# Patient Record
Sex: Male | Born: 1965 | Race: Black or African American | Hispanic: No | Marital: Single | Smoking: Current every day smoker
Health system: Southern US, Community
[De-identification: ages and names within clinical notes are randomized; demographics above are authoritative.]

## PROBLEM LIST (undated history)

## (undated) DIAGNOSIS — IMO0002 Reserved for concepts with insufficient information to code with codable children: Secondary | ICD-10-CM

## (undated) DIAGNOSIS — F419 Anxiety disorder, unspecified: Secondary | ICD-10-CM

## (undated) DIAGNOSIS — J45909 Unspecified asthma, uncomplicated: Secondary | ICD-10-CM

## (undated) DIAGNOSIS — E785 Hyperlipidemia, unspecified: Secondary | ICD-10-CM

## (undated) DIAGNOSIS — I10 Essential (primary) hypertension: Secondary | ICD-10-CM

## (undated) DIAGNOSIS — E7800 Pure hypercholesterolemia, unspecified: Secondary | ICD-10-CM

## (undated) HISTORY — DX: Reserved for concepts with insufficient information to code with codable children: IMO0002

## (undated) HISTORY — DX: Hyperlipidemia, unspecified: E78.5

## (undated) HISTORY — DX: Essential (primary) hypertension: I10

## (undated) HISTORY — PX: CHOLECYSTECTOMY: SHX55

## (undated) HISTORY — PX: OTHER SURGICAL HISTORY: SHX169

## (undated) HISTORY — DX: Anxiety disorder, unspecified: F41.9

## (undated) HISTORY — DX: Unspecified asthma, uncomplicated: J45.909
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
## 2007-02-07 ENCOUNTER — Encounter: Admission: RE | Admit: 2007-02-07 | Discharge: 2007-02-07 | Payer: Self-pay | Admitting: Internal Medicine

## 2008-01-19 ENCOUNTER — Emergency Department (HOSPITAL_BASED_OUTPATIENT_CLINIC_OR_DEPARTMENT_OTHER): Admission: EM | Admit: 2008-01-19 | Discharge: 2008-01-19 | Payer: Self-pay | Admitting: Emergency Medicine

## 2008-05-17 ENCOUNTER — Ambulatory Visit: Payer: Self-pay | Admitting: Diagnostic Radiology

## 2008-05-17 ENCOUNTER — Emergency Department (HOSPITAL_BASED_OUTPATIENT_CLINIC_OR_DEPARTMENT_OTHER): Admission: EM | Admit: 2008-05-17 | Discharge: 2008-05-17 | Payer: Self-pay | Admitting: Emergency Medicine

## 2008-07-26 ENCOUNTER — Ambulatory Visit: Payer: Self-pay | Admitting: Diagnostic Radiology

## 2008-07-26 ENCOUNTER — Emergency Department (HOSPITAL_BASED_OUTPATIENT_CLINIC_OR_DEPARTMENT_OTHER): Admission: EM | Admit: 2008-07-26 | Discharge: 2008-07-26 | Payer: Self-pay | Admitting: Emergency Medicine

## 2008-10-08 ENCOUNTER — Ambulatory Visit: Payer: Self-pay | Admitting: Diagnostic Radiology

## 2008-10-08 ENCOUNTER — Emergency Department (HOSPITAL_BASED_OUTPATIENT_CLINIC_OR_DEPARTMENT_OTHER): Admission: EM | Admit: 2008-10-08 | Discharge: 2008-10-08 | Payer: Self-pay | Admitting: Emergency Medicine

## 2009-01-14 ENCOUNTER — Emergency Department (HOSPITAL_BASED_OUTPATIENT_CLINIC_OR_DEPARTMENT_OTHER): Admission: EM | Admit: 2009-01-14 | Discharge: 2009-01-14 | Payer: Self-pay | Admitting: Emergency Medicine

## 2009-04-08 ENCOUNTER — Emergency Department (HOSPITAL_BASED_OUTPATIENT_CLINIC_OR_DEPARTMENT_OTHER): Admission: EM | Admit: 2009-04-08 | Discharge: 2009-04-08 | Payer: Self-pay | Admitting: Emergency Medicine

## 2010-05-30 ENCOUNTER — Emergency Department (HOSPITAL_BASED_OUTPATIENT_CLINIC_OR_DEPARTMENT_OTHER)
Admission: EM | Admit: 2010-05-30 | Discharge: 2010-05-30 | Payer: Self-pay | Source: Home / Self Care | Admitting: Emergency Medicine

## 2010-09-12 LAB — COMPREHENSIVE METABOLIC PANEL
Alkaline Phosphatase: 90 U/L (ref 39–117)
BUN: 15 mg/dL (ref 6–23)
CO2: 28 mEq/L (ref 19–32)
Calcium: 9.1 mg/dL (ref 8.4–10.5)
GFR calc non Af Amer: >60 mL/min (ref >60)
Glucose, Bld: 83 mg/dL (ref 70–99)
Total Protein: 8.1 g/dL (ref 6.0–8.3)

## 2010-09-12 LAB — LIPASE, BLOOD: Lipase: 51 U/L (ref 23–300)

## 2010-09-12 LAB — URINALYSIS, ROUTINE W REFLEX MICROSCOPIC
Bilirubin Urine: NEGATIVE
Glucose, UA: NEGATIVE mg/dL
Hgb urine dipstick: NEGATIVE
Specific Gravity, Urine: 1.024 (ref 1.005–1.030)
pH: 5.5 (ref 5.0–8.0)

## 2010-09-12 LAB — DIFFERENTIAL
Basophils Absolute: 0 10*3/uL (ref 0.0–0.1)
Eosinophils Absolute: 0.5 10*3/uL (ref 0.0–0.7)
Lymphocytes Relative: 49 % — ABNORMAL HIGH (ref 12–46)
Lymphs Abs: 2.5 10*3/uL (ref 0.7–4.0)
Neutrophils Relative %: 28 % — ABNORMAL LOW (ref 43–77)

## 2010-09-12 LAB — CBC
HCT: 45.5 % (ref 39.0–52.0)
Hemoglobin: 15.5 g/dL (ref 13.0–17.0)
MCHC: 34 g/dL (ref 30.0–36.0)
MCV: 94.1 fL (ref 78.0–100.0)
RBC: 4.84 MIL/uL (ref 4.22–5.81)

## 2010-11-03 ENCOUNTER — Emergency Department (HOSPITAL_BASED_OUTPATIENT_CLINIC_OR_DEPARTMENT_OTHER)
Admission: EM | Admit: 2010-11-03 | Discharge: 2010-11-03 | Disposition: A | Discharge status: Home / Self Care | Payer: Managed Care, Other (non HMO) | Attending: Emergency Medicine | Admitting: Emergency Medicine

## 2010-11-03 ENCOUNTER — Emergency Department (INDEPENDENT_AMBULATORY_CARE_PROVIDER_SITE_OTHER): Payer: Managed Care, Other (non HMO)

## 2010-11-03 DIAGNOSIS — R079 Chest pain, unspecified: Secondary | ICD-10-CM

## 2010-11-03 DIAGNOSIS — G8929 Other chronic pain: Secondary | ICD-10-CM | POA: Insufficient documentation

## 2010-11-03 DIAGNOSIS — I10 Essential (primary) hypertension: Secondary | ICD-10-CM | POA: Insufficient documentation

## 2010-11-03 DIAGNOSIS — R0602 Shortness of breath: Secondary | ICD-10-CM

## 2010-11-03 DIAGNOSIS — R0789 Other chest pain: Secondary | ICD-10-CM | POA: Insufficient documentation

## 2010-11-03 DIAGNOSIS — F17200 Nicotine dependence, unspecified, uncomplicated: Secondary | ICD-10-CM | POA: Insufficient documentation

## 2010-11-03 DIAGNOSIS — J45909 Unspecified asthma, uncomplicated: Secondary | ICD-10-CM | POA: Insufficient documentation

## 2011-11-09 ENCOUNTER — Encounter: Payer: Self-pay | Admitting: Internal Medicine

## 2011-11-09 DIAGNOSIS — J45909 Unspecified asthma, uncomplicated: Secondary | ICD-10-CM | POA: Insufficient documentation

## 2011-11-09 DIAGNOSIS — Z0000 Encounter for general adult medical examination without abnormal findings: Secondary | ICD-10-CM | POA: Insufficient documentation

## 2011-11-09 DIAGNOSIS — I10 Essential (primary) hypertension: Secondary | ICD-10-CM | POA: Insufficient documentation

## 2011-11-12 ENCOUNTER — Other Ambulatory Visit (INDEPENDENT_AMBULATORY_CARE_PROVIDER_SITE_OTHER): Payer: Managed Care, Other (non HMO)

## 2011-11-12 ENCOUNTER — Encounter: Payer: Self-pay | Admitting: Internal Medicine

## 2011-11-12 ENCOUNTER — Ambulatory Visit (INDEPENDENT_AMBULATORY_CARE_PROVIDER_SITE_OTHER): Payer: Managed Care, Other (non HMO) | Admitting: Internal Medicine

## 2011-11-12 VITALS — BP 136/90 | HR 91 | Temp 98.0°F | Resp 16 | Ht 69.0 in | Wt 197.8 lb

## 2011-11-12 DIAGNOSIS — E785 Hyperlipidemia, unspecified: Secondary | ICD-10-CM | POA: Insufficient documentation

## 2011-11-12 DIAGNOSIS — M545 Low back pain, unspecified: Secondary | ICD-10-CM

## 2011-11-12 DIAGNOSIS — I10 Essential (primary) hypertension: Secondary | ICD-10-CM

## 2011-11-12 DIAGNOSIS — G8929 Other chronic pain: Secondary | ICD-10-CM

## 2011-11-12 DIAGNOSIS — Z0000 Encounter for general adult medical examination without abnormal findings: Secondary | ICD-10-CM

## 2011-11-12 LAB — BASIC METABOLIC PANEL
BUN: 15 mg/dL (ref 6–23)
Chloride: 104 mEq/L (ref 96–112)
Creatinine, Ser: 0.9 mg/dL (ref 0.4–1.5)
Glucose, Bld: 88 mg/dL (ref 70–99)

## 2011-11-12 LAB — URINALYSIS, ROUTINE W REFLEX MICROSCOPIC
Bilirubin Urine: NEGATIVE
Leukocytes, UA: NEGATIVE
Nitrite: NEGATIVE
Specific Gravity, Urine: >=1.03 (ref 1.000–1.030)
Total Protein, Urine: NEGATIVE
pH: 5.5 (ref 5.0–8.0)

## 2011-11-12 LAB — CBC WITH DIFFERENTIAL/PLATELET
Basophils Relative: 0.6 % (ref 0.0–3.0)
Eosinophils Relative: 9.7 % — ABNORMAL HIGH (ref 0.0–5.0)
Lymphocytes Relative: 39.1 % (ref 12.0–46.0)
MCV: 95.4 fl (ref 78.0–100.0)
Monocytes Relative: 9.6 % (ref 3.0–12.0)
Neutrophils Relative %: 41 % — ABNORMAL LOW (ref 43.0–77.0)
Platelets: 237 10*3/uL (ref 150.0–400.0)
RBC: 4.96 Mil/uL (ref 4.22–5.81)
WBC: 5 10*3/uL (ref 4.5–10.5)

## 2011-11-12 LAB — HEPATIC FUNCTION PANEL
ALT: 32 U/L (ref 0–53)
AST: 19 U/L (ref 0–37)
Albumin: 4.1 g/dL (ref 3.5–5.2)
Total Bilirubin: 0.6 mg/dL (ref 0.3–1.2)
Total Protein: 7.3 g/dL (ref 6.0–8.3)

## 2011-11-12 LAB — LIPID PANEL
Cholesterol: 222 mg/dL — ABNORMAL HIGH (ref 0–200)
VLDL: 19.4 mg/dL (ref 0.0–40.0)

## 2011-11-12 LAB — PSA: PSA: 0.51 ng/mL (ref 0.10–4.00)

## 2011-11-12 LAB — TSH: TSH: 0.41 u[IU]/mL (ref 0.35–5.50)

## 2011-11-12 LAB — LDL CHOLESTEROL, DIRECT: Direct LDL: 153.6 mg/dL

## 2011-11-12 MED ORDER — AMLODIPINE-OLMESARTAN 5-40 MG PO TABS: 1.0000 | ORAL_TABLET | Freq: Every day | ORAL | Status: DC

## 2011-11-12 MED ORDER — OXYCODONE HCL 10 MG PO TABS: 10.0000 mg | ORAL_TABLET | Freq: Two times a day (BID) | ORAL | Status: DC | PRN

## 2011-11-12 MED ORDER — ALBUTEROL SULFATE HFA 108 (90 BASE) MCG/ACT IN AERS: 2.0000 | INHALATION_SPRAY | Freq: Four times a day (QID) | RESPIRATORY_TRACT | Status: DC | PRN

## 2011-11-12 NOTE — Patient Instructions (Addendum)
Continue all other medications as before The oxycodone 10 mg twice per day as needed prescription is given today All of your medications were refilled today to [facility] Please have the pharmacy call with any refills you may need. You will be contacted regarding the referral for: MRI for the lower back, and orthopedic evaluation Please go to LAB in the Basement for the blood and/or urine tests to be done today You will be contacted by phone if any changes need to be made immediately.  Otherwise, you will receive a letter about your results with an explanation. You are otherwise up to date with prevention at this time Please return in 6 months, or sooner if needed

## 2011-11-12 NOTE — Assessment & Plan Note (Signed)
To re-start med, f/u next visit

## 2011-11-12 NOTE — Assessment & Plan Note (Signed)
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
## 2011-11-12 NOTE — Assessment & Plan Note (Addendum)
Ok for repeat pain med, but given chronicity, will need MRI and refer ortho to see if other tx approp, to help minimize narcotic use in the future;  [place] controlled substance query reviewed with pt today

## 2011-12-10 ENCOUNTER — Telehealth: Payer: Self-pay | Admitting: *Deleted

## 2011-12-10 MED ORDER — OXYCODONE HCL 10 MG PO TABS: 10.0000 mg | ORAL_TABLET | Freq: Two times a day (BID) | ORAL | Status: DC | PRN

## 2011-12-10 NOTE — Telephone Encounter (Signed)
Patient informed hardcopy requested is ready for pickup at the front desk.

## 2011-12-10 NOTE — Telephone Encounter (Signed)
Patient request refill on oxycodone 

## 2011-12-10 NOTE — Telephone Encounter (Signed)
Done hardcopy to robin  

## 2011-12-17 ENCOUNTER — Encounter: Payer: Self-pay | Admitting: Internal Medicine

## 2011-12-17 ENCOUNTER — Ambulatory Visit (INDEPENDENT_AMBULATORY_CARE_PROVIDER_SITE_OTHER): Payer: Managed Care, Other (non HMO) | Admitting: Internal Medicine

## 2011-12-17 VITALS — BP 124/90 | HR 87 | Temp 98.3°F | Ht 69.0 in | Wt 204.1 lb

## 2011-12-17 DIAGNOSIS — F411 Generalized anxiety disorder: Secondary | ICD-10-CM

## 2011-12-17 DIAGNOSIS — J45909 Unspecified asthma, uncomplicated: Secondary | ICD-10-CM

## 2011-12-17 DIAGNOSIS — I10 Essential (primary) hypertension: Secondary | ICD-10-CM

## 2011-12-17 DIAGNOSIS — F419 Anxiety disorder, unspecified: Secondary | ICD-10-CM

## 2011-12-17 MED ORDER — SERTRALINE HCL 100 MG PO TABS: 100.0000 mg | ORAL_TABLET | Freq: Every day | ORAL | Status: DC

## 2011-12-24 ENCOUNTER — Ambulatory Visit: Payer: Managed Care, Other (non HMO) | Admitting: Family Medicine

## 2011-12-28 ENCOUNTER — Other Ambulatory Visit: Payer: Self-pay

## 2011-12-28 MED ORDER — OXYCODONE HCL 10 MG PO TABS: 10.0000 mg | ORAL_TABLET | Freq: Two times a day (BID) | ORAL | Status: DC | PRN

## 2011-12-28 NOTE — Telephone Encounter (Signed)
Patient informed hardcopy at the front desk.

## 2011-12-28 NOTE — Telephone Encounter (Signed)
Done hardcopy to robin  

## 2011-12-30 ENCOUNTER — Encounter: Payer: Self-pay | Admitting: Internal Medicine

## 2011-12-30 DIAGNOSIS — F419 Anxiety disorder, unspecified: Secondary | ICD-10-CM | POA: Insufficient documentation

## 2011-12-30 HISTORY — DX: Anxiety disorder, unspecified: F41.9

## 2011-12-30 NOTE — Assessment & Plan Note (Signed)
Improving, forms to be filled out for employer, f/u with psychiatry as planned

## 2011-12-30 NOTE — Assessment & Plan Note (Signed)
stable overall by hx and exam, most recent data reviewed with pt, and pt to continue medical treatment as before SpO2 Readings from Last 3 Encounters:  12/17/11 96%  11/12/11 97%

## 2011-12-30 NOTE — Assessment & Plan Note (Signed)
stable overall by hx and exam, most recent data reviewed with pt, and pt to continue medical treatment as before BP Readings from Last 3 Encounters:  12/17/11 124/90  11/12/11 136/90

## 2011-12-30 NOTE — Progress Notes (Signed)
  Subjective:    Patient ID: Daniel Valencia, male    DOB: 1966-01-02, 46 y.o.   MRN: [id number]  HPI  Here to f/u; overall doing ok,  Pt denies chest pain, increased sob or doe, wheezing, orthopnea, PND, increased LE swelling, palpitations, dizziness or syncope.  Pt denies new neurological symptoms such as new headache, or facial or extremity weakness or numbness   Pt denies polydipsia, polyuria.  Pt states overall good compliance with meds, trying to follow lower cholesterol, diabetic diet, wt overall stable.  Denies worsening depressive symptoms, suicidal ideation, but has had severe episode of anxiety and panic, has been unable to work from July 16 through aug 4.  Has seen psychiatry, zoloft increased from 50 to 100 mg, needs forms filled out for employer.   Pt denies fever, wt loss, night sweats, loss of appetite, or other constitutional symptoms Past Medical History  Diagnosis Date  . Hypertension   . Unspecified asthma   . Hyperlipidemia   . Anxiety 12/30/2011   Past Surgical History  Procedure Date  . Cholecystectomy   . Right leg surgury     pin in right leg  . Right foot surgury apirl 2013    [facility]    reports that he has been smoking.  He has never used smokeless tobacco. He reports that he drinks alcohol. He reports that he does not use illicit drugs. family history includes Hypertension in his father. Allergies  Allergen Reactions  . Acetaminophen Other (See Comments)    Migraines.   Current Outpatient Prescriptions on File Prior to Visit  Medication Sig Dispense Refill  . albuterol (PROVENTIL HFA;VENTOLIN HFA) 108 (90 BASE) MCG/ACT inhaler Inhale 2 puffs into the lungs every 6 (six) hours as needed. ProAir.  1 Inhaler  11  . amLODipine-olmesartan (AZOR) 5-40 MG per tablet Take 1 tablet by mouth daily.  90 tablet  3  . multivitamin (ONE-A-DAY MEN'S) TABS Take 1 tablet by mouth daily.      . sertraline (ZOLOFT) 100 MG tablet Take 1 tablet (100 mg total) by mouth daily.  90  tablet  3   Review of Systems Review of Systems  Constitutional: Negative for diaphoresis and unexpected weight change.  HENT: Negative for tinnitus.   Eyes: Negative for photophobia and visual disturbance.  Respiratory: Negative for choking and stridor.   Gastrointestinal: Negative for vomiting and blood in stool.  Genitourinary: Negative for hematuria and decreased urine volume.  Musculoskeletal: Negative for gait problem.  Skin: Negative for color change and wound.  Neurological: Negative for tremors and numbness.     Objective:   Physical Exam BP 124/90  Pulse 87  Temp 98.3 F (36.8 C) (Oral)  Ht 5\' 9"  (1.753 m)  Wt 204 lb 2 oz (92.59 kg)  BMI 30.14 kg/m2  SpO2 96% Physical Exam  VS noted Constitutional: Pt appears well-developed and well-nourished.  HENT: Head: Normocephalic.  Right Ear: External ear normal.  Left Ear: External ear normal.  Eyes: Conjunctivae and EOM are normal. Pupils are equal, round, and reactive to light.  Neck: Normal range of motion. Neck supple.  Cardiovascular: Normal rate and regular rhythm.   Pulmonary/Chest: Effort normal and breath sounds normal.  Abd:  Soft, NT, non-distended, + BS Neurological: Pt is alert. No cranial nerve deficit.  Skin: Skin is warm. No erythema.  Psychiatric: Pt behavior is normal. Thought content normal. 1-2+ nervous    Assessment & Plan:

## 2011-12-30 NOTE — Patient Instructions (Addendum)
Continue all other medications as before Your forms will be filled out Please keep your appointments with your specialists as you have planned

## 2012-01-02 ENCOUNTER — Telehealth: Payer: Self-pay

## 2012-01-02 MED ORDER — OXYCODONE HCL 10 MG PO TABS: 10.0000 mg | ORAL_TABLET | Freq: Every day | ORAL | Status: DC

## 2012-01-02 NOTE — Telephone Encounter (Signed)
Called left msg. To call back 

## 2012-01-02 NOTE — Telephone Encounter (Signed)
Sorry, no work note without OV

## 2012-01-02 NOTE — Telephone Encounter (Signed)
Ok qid Thx

## 2012-01-02 NOTE — Telephone Encounter (Signed)
Patient was unable to work on Monday December 31, 2011 due to being sick.  Is it ok to do note for work. Please advise.  Will call patient when ready and can fax to the patient.

## 2012-01-02 NOTE — Telephone Encounter (Signed)
PCP filled Oxycodone 10 mg 1 po bid #120.  Insurance will not pay written bid.  Please advise on rx in PCP's absence.  Previous rx is on CMA's desk to discard.  Please prescribed oxycodone 10 mg 1 po qd #120 insurance will cover. Please advise

## 2012-01-02 NOTE — Telephone Encounter (Signed)
Called the patient to pickup prescription at front desk. Discarded returned prescription written December 28, 2011 taking oxycodone 60 mg bid.

## 2012-01-03 NOTE — Telephone Encounter (Signed)
Patient informed. 

## 2012-01-10 ENCOUNTER — Telehealth: Payer: Self-pay

## 2012-01-10 DIAGNOSIS — G8929 Other chronic pain: Secondary | ICD-10-CM

## 2012-01-10 MED ORDER — HYDROCODONE-ACETAMINOPHEN 5-325 MG PO TABS: 1.0000 | ORAL_TABLET | Freq: Four times a day (QID) | ORAL | Status: AC | PRN

## 2012-01-10 NOTE — Telephone Encounter (Signed)
Please inform the pt that the original rx was not written "wrong" but that there was an insurance issue with the number of pills allowed  Chart reviewed,  We had ordered an MRI in June but this was not done.  I cannot continue the oxycodone without further evaluation.  At this time, I can only authorize hydrocodone 5/325 with the intention of eventually reducing the tx further in the future  Done hardcopy to dahlia/[facility]

## 2012-01-10 NOTE — Telephone Encounter (Signed)
Patient informed. 

## 2012-01-10 NOTE — Telephone Encounter (Signed)
Done per emr 

## 2012-01-10 NOTE — Telephone Encounter (Signed)
Called the patient informed of MD instructions. Faxed hardcopy to [facility].  The patient would like to have an MRI, please advise.

## 2012-01-10 NOTE — Telephone Encounter (Signed)
Daniel Valencia at [facility] confirms receipt of Rx written 07/31 for Oxycodone 10 mg QD #120. Pt's insurance pays for 30 day supply only, dispensed 07/31. Remaining tablets automatically cancelled.

## 2012-01-10 NOTE — Telephone Encounter (Signed)
Pt called stating he received a Rx for Oxycodone in PCP's absence for #120 1 po qd. Pt took Rx to pharmacy [facility]) and was given #30. Pt says that he did not realize until he received medication that Rx was written wrong. Pt is supposed to take 1 po BID # 60. Pt is requesting to use current medication for a 15 day supply and pick up new Rx for BID # 60. Please advise.   (Remaining tablets on Rx will need to be cancelled at [facility] because Rx needs to go to [facility] per Insurance)

## 2012-01-10 NOTE — Telephone Encounter (Signed)
Dahlia to verify with controlled substance query, and/or verify with [facility] please

## 2012-01-17 ENCOUNTER — Ambulatory Visit
Admission: RE | Admit: 2012-01-17 | Discharge: 2012-01-17 | Disposition: A | Discharge status: Home / Self Care | Payer: Managed Care, Other (non HMO) | Source: Ambulatory Visit | Attending: Internal Medicine | Admitting: Internal Medicine

## 2012-01-17 ENCOUNTER — Encounter: Payer: Self-pay | Admitting: Internal Medicine

## 2012-01-17 DIAGNOSIS — G8929 Other chronic pain: Secondary | ICD-10-CM

## 2012-01-17 DIAGNOSIS — M545 Low back pain: Secondary | ICD-10-CM

## 2012-01-21 ENCOUNTER — Telehealth: Payer: Self-pay

## 2012-01-21 MED ORDER — TRAMADOL HCL 50 MG PO TABS: 50.0000 mg | ORAL_TABLET | Freq: Four times a day (QID) | ORAL | Status: AC | PRN

## 2012-01-21 MED ORDER — CYCLOBENZAPRINE HCL 5 MG PO TABS: 5.0000 mg | ORAL_TABLET | Freq: Three times a day (TID) | ORAL | Status: AC | PRN

## 2012-01-21 NOTE — Telephone Encounter (Signed)
Called the patient left message to call back 

## 2012-01-21 NOTE — Telephone Encounter (Signed)
Letter should have been sent  MRI normal - no signficant abnormality  Will need to start trial of "backing off" of oxycodone since the MRI was normal - ok for change to tramadol prn, and flexeril prn as well - done erx

## 2012-01-21 NOTE — Telephone Encounter (Signed)
Patient informed. 

## 2012-01-21 NOTE — Telephone Encounter (Signed)
Pt called requesting results of recent MRI and refill of pain medication, please advise.

## 2012-02-07 ENCOUNTER — Encounter (HOSPITAL_BASED_OUTPATIENT_CLINIC_OR_DEPARTMENT_OTHER): Payer: Self-pay | Admitting: Emergency Medicine

## 2012-02-07 ENCOUNTER — Emergency Department (HOSPITAL_BASED_OUTPATIENT_CLINIC_OR_DEPARTMENT_OTHER)
Admission: EM | Admit: 2012-02-07 | Discharge: 2012-02-08 | Disposition: A | Discharge status: Home / Self Care | Payer: Managed Care, Other (non HMO) | Attending: Emergency Medicine | Admitting: Emergency Medicine

## 2012-02-07 DIAGNOSIS — J45901 Unspecified asthma with (acute) exacerbation: Secondary | ICD-10-CM

## 2012-02-07 DIAGNOSIS — F17200 Nicotine dependence, unspecified, uncomplicated: Secondary | ICD-10-CM | POA: Insufficient documentation

## 2012-02-07 DIAGNOSIS — J45909 Unspecified asthma, uncomplicated: Secondary | ICD-10-CM | POA: Insufficient documentation

## 2012-02-07 DIAGNOSIS — I10 Essential (primary) hypertension: Secondary | ICD-10-CM | POA: Insufficient documentation

## 2012-02-07 DIAGNOSIS — E785 Hyperlipidemia, unspecified: Secondary | ICD-10-CM | POA: Insufficient documentation

## 2012-02-07 DIAGNOSIS — Z79899 Other long term (current) drug therapy: Secondary | ICD-10-CM | POA: Insufficient documentation

## 2012-02-07 DIAGNOSIS — F411 Generalized anxiety disorder: Secondary | ICD-10-CM | POA: Insufficient documentation

## 2012-02-07 MED ORDER — ALBUTEROL SULFATE (5 MG/ML) 0.5% IN NEBU
5.0000 mg | INHALATION_SOLUTION | Freq: Once | RESPIRATORY_TRACT | Status: AC
  Administered 2012-02-07: 5 mg via RESPIRATORY_TRACT

## 2012-02-07 MED ORDER — ALBUTEROL SULFATE (5 MG/ML) 0.5% IN NEBU
INHALATION_SOLUTION | RESPIRATORY_TRACT | Status: AC
  Administered 2012-02-07: 5 mg via RESPIRATORY_TRACT
  Filled 2012-02-07: qty 1

## 2012-02-07 NOTE — ED Notes (Signed)
Asthmatic attack   

## 2012-02-08 ENCOUNTER — Ambulatory Visit: Payer: Managed Care, Other (non HMO) | Admitting: Internal Medicine

## 2012-02-08 ENCOUNTER — Emergency Department (HOSPITAL_BASED_OUTPATIENT_CLINIC_OR_DEPARTMENT_OTHER): Payer: Managed Care, Other (non HMO)

## 2012-02-08 MED ORDER — ALBUTEROL SULFATE HFA 108 (90 BASE) MCG/ACT IN AERS
2.0000 | INHALATION_SPRAY | RESPIRATORY_TRACT | Status: DC | PRN
  Administered 2012-02-08: 2 via RESPIRATORY_TRACT
  Filled 2012-02-08: qty 6.7

## 2012-02-08 MED ORDER — PREDNISONE 50 MG PO TABS
60.0000 mg | ORAL_TABLET | Freq: Once | ORAL | Status: AC
  Administered 2012-02-08: 60 mg via ORAL
  Filled 2012-02-08: qty 1

## 2012-02-08 MED ORDER — ALBUTEROL SULFATE (5 MG/ML) 0.5% IN NEBU
5.0000 mg | INHALATION_SOLUTION | Freq: Once | RESPIRATORY_TRACT | Status: AC
  Administered 2012-02-08: 5 mg via RESPIRATORY_TRACT
  Filled 2012-02-08: qty 1

## 2012-02-08 MED ORDER — PREDNISONE 10 MG PO TABS: 20.0000 mg | ORAL_TABLET | Freq: Every day | ORAL | Status: DC

## 2012-02-08 NOTE — Patient Instructions (Signed)
Instructed pt on the proper use of administering albuteral mdi via aerochamber pt tolerated well 

## 2012-02-08 NOTE — ED Notes (Signed)
Patient transported to X-ray 

## 2012-02-08 NOTE — ED Provider Notes (Signed)
History     CSN: [id number]  Arrival date & time 02/07/12  2309   First MD Initiated Contact with Patient 02/07/12 2359      Chief Complaint  Patient presents with  . Asthma    (Consider location/radiation/quality/duration/timing/severity/associated sxs/prior treatment) HPI  Patient with history of asthma with uri symptoms began on Friday and seen by pmd and started on prednisone.  Using inhaler 5-6 times per day.  Took two prednisone today and coughed up phlegm.  Patient on z-pack.  Patient now out of inhaler.  Chest with some increased tightness.  Continues to cough.  No fever or chills.   Past Medical History  Diagnosis Date  . Hypertension   . Unspecified asthma   . Hyperlipidemia   . Anxiety 12/30/2011    Past Surgical History  Procedure Date  . Cholecystectomy   . Right leg surgury     pin in right leg  . Right foot surgury apirl 2013    [facility]    Family History  Problem Relation Age of Onset  . Hypertension Father     History  Substance Use Topics  . Smoking status: Current Everyday Smoker  . Smokeless tobacco: Never Used  . Alcohol Use: Yes      Review of Systems  Constitutional: Negative for fever and chills.  HENT: Negative for neck stiffness.   Eyes: Negative for visual disturbance.  Respiratory: Negative for shortness of breath.   Cardiovascular: Negative for chest pain.  Gastrointestinal: Negative for vomiting, diarrhea and blood in stool.  Genitourinary: Negative for dysuria, frequency and decreased urine volume.  Musculoskeletal: Negative for myalgias and joint swelling.  Skin: Negative for rash.  Neurological: Negative for weakness.  Hematological: Negative for adenopathy.  Psychiatric/Behavioral: Negative for agitation.    Allergies  Acetaminophen and Peanuts  Home Medications   Current Outpatient Rx  Name Route Sig Dispense Refill  . ALBUTEROL SULFATE HFA 108 (90 BASE) MCG/ACT IN AERS Inhalation Inhale 2 puffs into the lungs  every 6 (six) hours as needed. ProAir. 1 Inhaler 11  . AMLODIPINE-OLMESARTAN 5-40 MG PO TABS Oral Take 1 tablet by mouth daily. 90 tablet 3  . ONE-A-DAY MENS PO TABS Oral Take 1 tablet by mouth daily.    . SERTRALINE HCL 100 MG PO TABS Oral Take 1 tablet (100 mg total) by mouth daily. 90 tablet 3    BP 151/97  Pulse 86  Temp 98.1 F (36.7 C) (Oral)  Resp 20  Ht 5\' 9"  (1.753 m)  Wt 201 lb (91.173 kg)  BMI 29.68 kg/m2  SpO2 97%  Physical Exam  Nursing note and vitals reviewed. Constitutional: He is oriented to person, place, and time. He appears well-developed and well-nourished.  HENT:  Head: Normocephalic and atraumatic.  Right Ear: External ear normal.  Left Ear: External ear normal.  Nose: Nose normal.  Mouth/Throat: Oropharynx is clear and moist.  Eyes: Conjunctivae and EOM are normal. Pupils are equal, round, and reactive to light.  Neck: Normal range of motion. Neck supple.  Cardiovascular: Normal rate, regular rhythm, normal heart sounds and intact distal pulses.   Pulmonary/Chest: Effort normal. No respiratory distress. He has wheezes. He exhibits no tenderness.       Diffuse rhonchi  Abdominal: Soft. Bowel sounds are normal. He exhibits no distension and no mass. There is no tenderness. There is no guarding.  Musculoskeletal: Normal range of motion.  Neurological: He is alert and oriented to person, place, and time. He has normal reflexes.  He exhibits normal muscle tone. Coordination normal.  Skin: Skin is warm and dry.  Psychiatric: He has a normal mood and affect. His behavior is normal. Judgment and thought content normal.    ED Course  Procedures (including critical care time)  Labs Reviewed - No data to display No results found.   No diagnosis found.    MDM  He should given additional albuterol and increased prednisone here. He is comfortable and has a normal respiratory rate with normal upper with sats in the upper 90s. His heart rate is normal. He  continues to have some expiratory wheezes. He is out of his albuterol MDI and is given that here. He is also given an extended dose of the prednisone. He has been on Zithromax and has no acute infiltrate on his chest x-Elan Mcelvain. He is counseled regarding his smoking. He is advised to followup with his doctor in the next one to 4 days prior to completing the new course of steroids. He understands that he should return if he is worse at any time especially having worsening breathing problems. Patient ambulated in ED with O2 saturations maintained >90, no current signs of respiratory distress. Lung exam improved after hospital treatment. Pt states they are breathing at baseline. Pt has been instructed to continue using prescribed medications and to speak with PCP about today's exacerbation.        Hilario Quarry, MD 02/08/12 [phone]

## 2012-02-09 ENCOUNTER — Other Ambulatory Visit: Payer: Self-pay | Admitting: Internal Medicine

## 2012-02-09 ENCOUNTER — Encounter: Payer: Self-pay | Admitting: Family Medicine

## 2012-02-09 ENCOUNTER — Ambulatory Visit (INDEPENDENT_AMBULATORY_CARE_PROVIDER_SITE_OTHER): Payer: Managed Care, Other (non HMO) | Admitting: Family Medicine

## 2012-02-09 VITALS — BP 128/90 | HR 84 | Temp 97.3°F | Wt 195.0 lb

## 2012-02-09 DIAGNOSIS — J45909 Unspecified asthma, uncomplicated: Secondary | ICD-10-CM

## 2012-02-09 DIAGNOSIS — J40 Bronchitis, not specified as acute or chronic: Secondary | ICD-10-CM

## 2012-02-09 MED ORDER — PREDNISONE 10 MG PO TABS: ORAL_TABLET | ORAL | Status: DC

## 2012-02-09 MED ORDER — MOXIFLOXACIN HCL 400 MG PO TABS: 400.0000 mg | ORAL_TABLET | Freq: Every day | ORAL | Status: AC

## 2012-02-09 MED ORDER — METHYLPREDNISOLONE ACETATE 80 MG/ML IJ SUSP
80.0000 mg | Freq: Once | INTRAMUSCULAR | Status: AC
  Administered 2012-02-09: 80 mg via INTRAMUSCULAR

## 2012-02-09 MED ORDER — BUDESONIDE-FORMOTEROL FUMARATE 160-4.5 MCG/ACT IN AERO: 2.0000 | INHALATION_SPRAY | Freq: Two times a day (BID) | RESPIRATORY_TRACT | Status: DC

## 2012-02-09 MED ORDER — GUAIFENESIN-CODEINE 100-10 MG/5ML PO SYRP: 5.0000 mL | ORAL_SOLUTION | Freq: Three times a day (TID) | ORAL | Status: AC | PRN

## 2012-02-09 NOTE — Patient Instructions (Signed)

## 2012-02-09 NOTE — Progress Notes (Signed)
  Subjective:     Daniel Valencia is a 46 y.o. male here for evaluation of a cough. Onset of symptoms was several days ago. Symptoms have been gradually worsening since that time. The cough is productive and is aggravated by exercise, stress and reclining position. Associated symptoms include: shortness of breath, sputum production and wheezing. Patient does have a history of asthma. Patient does have a history of environmental allergens. Patient has not traveled recently. Patient does have a history of smoking. Patient has had a previous chest x-ray. Patient has not had a PPD done.  He was seen in a UC and ER over the last several days.  The following portions of the patient's history were reviewed and updated as appropriate: allergies, current medications, past family history, past medical history, past social history, past surgical history and problem list.  Review of Systems Pertinent items are noted in HPI.    Objective:    Oxygen saturation 96% on room air BP 128/90  Pulse 84  Temp 97.3 F (36.3 C) (Oral)  Wt 195 lb (88.451 kg)  SpO2 96% General appearance: alert, cooperative, appears stated age and mild distress Ears: normal TM's and external ear canals both ears Nose: Nares normal. Septum midline. Mucosa normal. No drainage or sinus tenderness. Throat: lips, mucosa, and tongue normal; teeth and gums normal Neck: no adenopathy, supple, symmetrical, trachea midline and thyroid not enlarged, symmetric, no tenderness/mass/nodules Lungs: rhonchi bilaterally and wheezes bilaterally Heart: S1, S2 normal    Assessment:    Acute Bronchitis and Asthma    Plan:    Antibiotics per medication orders. Antitussives per medication orders. Avoid exposure to tobacco smoke and fumes. B-agonist inhaler. Call if shortness of breath worsens, blood in sputum, change in character of cough, development of fever or chills, inability to maintain nutrition and hydration. Avoid exposure to tobacco smoke  and fumes. Steroid inhaler as ordered. pred taper,  depo medrol---f/u pcp next week  Pt encouraged to quit smoking

## 2012-02-09 NOTE — Addendum Note (Signed)
Addended byRosalio Macadamia, Aidel Davisson B on: 02/09/2012 12:45 PM   Modules accepted: Orders

## 2012-02-11 MED ORDER — NAPROXEN 500 MG PO TABS: 500.0000 mg | ORAL_TABLET | Freq: Two times a day (BID) | ORAL | Status: DC

## 2012-02-11 NOTE — Telephone Encounter (Signed)
As MRI was normal, we need to stop the hydrocodone  OK to add naproxen 500 bid prn - done per emr

## 2012-02-11 NOTE — Telephone Encounter (Signed)
Patient informed. 

## 2012-02-15 ENCOUNTER — Telehealth: Payer: Self-pay | Admitting: Internal Medicine

## 2012-02-15 NOTE — Telephone Encounter (Signed)
Pt is calling for refill on Klonopin, call pt when ready to pick up °

## 2012-02-15 NOTE — Telephone Encounter (Signed)
Pt is calling for refill on Klonopin, call pt when ready to pick up

## 2012-02-15 NOTE — Telephone Encounter (Signed)
I have never prescribed klonopin for this pt  I have reviewed the [place] controlled substance registry  He has received klonopin from 2 other MD's since starting here November 12, 2011, including Dr Shawnee Knapp in June, and a Dr Crist Infante in July.  I would not feel comfortable prescribing this medication for this pt who has obtained similar med from 2 other MD since beginning here

## 2012-02-18 ENCOUNTER — Telehealth: Payer: Self-pay

## 2012-02-18 NOTE — Telephone Encounter (Signed)
Called left message to call back 

## 2012-02-18 NOTE — Telephone Encounter (Signed)
Informed the patient of Md instructions on medication refill.

## 2012-02-18 NOTE — Telephone Encounter (Signed)
Called the patient informed to schedule appt. Per MD instructions to complete form from [facility] Clinician Statement.  Patient agreed to schedule appt.  Form is in folder on [employer].

## 2012-02-21 ENCOUNTER — Encounter: Payer: Self-pay | Admitting: Internal Medicine

## 2012-02-21 ENCOUNTER — Ambulatory Visit (INDEPENDENT_AMBULATORY_CARE_PROVIDER_SITE_OTHER): Payer: Managed Care, Other (non HMO) | Admitting: Internal Medicine

## 2012-02-21 VITALS — BP 152/102 | HR 83 | Temp 97.4°F | Ht 69.0 in | Wt 191.4 lb

## 2012-02-21 DIAGNOSIS — F319 Bipolar disorder, unspecified: Secondary | ICD-10-CM

## 2012-02-21 DIAGNOSIS — I10 Essential (primary) hypertension: Secondary | ICD-10-CM

## 2012-02-21 DIAGNOSIS — Z0000 Encounter for general adult medical examination without abnormal findings: Secondary | ICD-10-CM

## 2012-02-21 DIAGNOSIS — J309 Allergic rhinitis, unspecified: Secondary | ICD-10-CM | POA: Insufficient documentation

## 2012-02-21 DIAGNOSIS — R41840 Attention and concentration deficit: Secondary | ICD-10-CM | POA: Insufficient documentation

## 2012-02-21 MED ORDER — AMLODIPINE-OLMESARTAN 5-40 MG PO TABS: 1.0000 | ORAL_TABLET | Freq: Every day | ORAL | Status: DC

## 2012-02-21 MED ORDER — CLONAZEPAM 1 MG PO TABS: 1.0000 mg | ORAL_TABLET | Freq: Two times a day (BID) | ORAL | Status: DC | PRN

## 2012-02-21 MED ORDER — FLUTICASONE PROPIONATE 50 MCG/ACT NA SUSP: 2.0000 | Freq: Every day | NASAL | Status: DC

## 2012-02-21 MED ORDER — FEXOFENADINE HCL 180 MG PO TABS: 180.0000 mg | ORAL_TABLET | Freq: Every day | ORAL | Status: DC

## 2012-02-21 NOTE — Assessment & Plan Note (Signed)
Pt to re-start the flonase he has at home; ok for allegra prn as well

## 2012-02-21 NOTE — Assessment & Plan Note (Signed)
Probable based on hx today; for klonopin refill but hold on SSRI as can make bipolar worse - for psychiatric referral (pt to call [facility])

## 2012-02-21 NOTE — Patient Instructions (Addendum)
Take all new medications as prescribed - the klonopin You are given the hardcopy of the flonase and allegra if you need it Continue all other medications as before You are given the samples of the azor (3 wks) and a prescription also was sent to [facility] Please make appointment with [facility], as I suspect you most likely have Bipolar Affective Disorder, as well as possible ADD Please return in 9 mo with Lab testing done 3-5 days before

## 2012-02-23 ENCOUNTER — Encounter: Payer: Self-pay | Admitting: Internal Medicine

## 2012-02-23 NOTE — Assessment & Plan Note (Addendum)
Mild elev today, stable overall by hx and exam, most recent data reviewed with pt, and pt to continue medical treatment as before - declines further change in tx at this time except to re-start BP med BP Readings from Last 3 Encounters:  02/21/12 152/102  02/09/12 128/90  02/07/12 151/97

## 2012-02-23 NOTE — Progress Notes (Signed)
Subjective:    Patient ID: Daniel Valencia, male    DOB: [birth year], 46 y.o.   MRN: [id number]  HPI  Here to f/u; overall doing ok,  Pt denies chest pain, increased sob or doe, wheezing, orthopnea, PND, increased LE swelling, palpitations, dizziness or syncope.  Pt denies new neurological symptoms such as new headache, or facial or extremity weakness or numbness   Pt denies polydipsia, polyuria, or low sugar symptoms such as weakness or confusion improved with po intake.  Pt states overall good compliance with meds, trying to follow lower cholesterol  diet, wt overall stable but little exercise however. Has ongoing anxiety and occas near panic worse in the past 2-3 mo, episodes of what sounds like mania or hypo-mania with low mood episode as well. Does have several wks ongoing nasal allergy symptoms with clear congestion, itch and sneeze, without fever, pain, ST, cough or wheezing. Out of BP med for several wks as well Past Medical History  Diagnosis Date  . Hypertension   . Unspecified asthma   . Hyperlipidemia   . Anxiety 12/30/2011   Past Surgical History  Procedure Date  . Cholecystectomy   . Right leg surgury     pin in right leg  . Right foot surgury apirl 2013    [facility]    reports that he has been smoking.  He has never used smokeless tobacco. He reports that he drinks alcohol. He reports that he does not use illicit drugs. family history includes Hypertension in his father. Allergies  Allergen Reactions  . Acetaminophen Other (See Comments)    Migraines.  . Peanuts (Peanut Oil)    Current Outpatient Prescriptions on File Prior to Visit  Medication Sig Dispense Refill  . albuterol (PROVENTIL HFA;VENTOLIN HFA) 108 (90 BASE) MCG/ACT inhaler Inhale 2 puffs into the lungs every 6 (six) hours as needed. ProAir.  1 Inhaler  11  . amLODipine-olmesartan (AZOR) 5-40 MG per tablet Take 1 tablet by mouth daily.  90 tablet  3  . budesonide-formoterol (SYMBICORT) 160-4.5 MCG/ACT inhaler  Inhale 2 puffs into the lungs 2 (two) times daily.  1 Inhaler  3  . multivitamin (ONE-A-DAY MEN'S) TABS Take 1 tablet by mouth daily.      . naproxen (NAPROSYN) 500 MG tablet Take 1 tablet (500 mg total) by mouth 2 (two) times daily with a meal.  60 tablet  2  . clonazePAM (KLONOPIN) 1 MG tablet Take 1 tablet (1 mg total) by mouth 2 (two) times daily as needed for anxiety.  60 tablet  0  . fexofenadine (ALLEGRA) 180 MG tablet Take 1 tablet (180 mg total) by mouth daily.  90 tablet  3  . fluticasone (FLONASE) 50 MCG/ACT nasal spray Place 2 sprays into the nose daily.  16 g  5   Review of Systems  Constitutional: Negative for diaphoresis and unexpected weight change.  HENT: Negative for tinnitus.   Eyes: Negative for photophobia and visual disturbance.  Respiratory: Negative for choking and stridor.   Gastrointestinal: Negative for vomiting and blood in stool.  Genitourinary: Negative for hematuria and decreased urine volume.  Musculoskeletal: Negative for gait problem.  Skin: Negative for color change and wound.  Neurological: Negative for tremors and numbness.  Psychiatric/Behavioral: Negative for decreased concentration. The patient is not hyperactive.       Objective:   Physical Exam BP 152/102  Pulse 83  Temp 97.4 F (36.3 C) (Oral)  Ht 5\' 9"  (1.753 m)  Wt 191 lb 6  oz (86.807 kg)  BMI 28.26 kg/m2  SpO2 97% Physical Exam  VS noted Constitutional: Pt appears well-developed and well-nourished.  HENT: Head: Normocephalic.  Right Ear: External ear normal.  Left Ear: External ear normal.  Bilat tm's mild erythema.  Sinus nontender.  Pharynx mild erythema Eyes: Conjunctivae and EOM are normal. Pupils are equal, round, and reactive to light.  Neck: Normal range of motion. Neck supple.  Cardiovascular: Normal rate and regular rhythm.   Pulmonary/Chest: Effort normal and breath sounds normal.  Abd:  Soft, NT, non-distended, + BS Neurological: Pt is alert. Not confused  Skin: Skin  is warm. No erythema.  Psychiatric: Pt behavior is normal. Thought content normal. 2+ nervous    Assessment & Plan:

## 2012-02-23 NOTE — Assessment & Plan Note (Signed)
?   ADD - for psych eval for this

## 2012-03-21 ENCOUNTER — Encounter (HOSPITAL_BASED_OUTPATIENT_CLINIC_OR_DEPARTMENT_OTHER): Payer: Self-pay | Admitting: *Deleted

## 2012-03-21 ENCOUNTER — Emergency Department (HOSPITAL_BASED_OUTPATIENT_CLINIC_OR_DEPARTMENT_OTHER)
Admission: EM | Admit: 2012-03-21 | Discharge: 2012-03-22 | Disposition: A | Discharge status: Home / Self Care | Payer: Self-pay | Attending: Emergency Medicine | Admitting: Emergency Medicine

## 2012-03-21 DIAGNOSIS — E785 Hyperlipidemia, unspecified: Secondary | ICD-10-CM | POA: Insufficient documentation

## 2012-03-21 DIAGNOSIS — I10 Essential (primary) hypertension: Secondary | ICD-10-CM | POA: Insufficient documentation

## 2012-03-21 DIAGNOSIS — J45909 Unspecified asthma, uncomplicated: Secondary | ICD-10-CM | POA: Insufficient documentation

## 2012-03-21 DIAGNOSIS — F17200 Nicotine dependence, unspecified, uncomplicated: Secondary | ICD-10-CM | POA: Insufficient documentation

## 2012-03-21 DIAGNOSIS — J45901 Unspecified asthma with (acute) exacerbation: Secondary | ICD-10-CM

## 2012-03-21 MED ORDER — ALBUTEROL SULFATE (5 MG/ML) 0.5% IN NEBU
INHALATION_SOLUTION | RESPIRATORY_TRACT | Status: AC
  Administered 2012-03-21: 5 mg via RESPIRATORY_TRACT
  Filled 2012-03-21: qty 1

## 2012-03-21 MED ORDER — ALBUTEROL SULFATE (5 MG/ML) 0.5% IN NEBU
5.0000 mg | INHALATION_SOLUTION | Freq: Once | RESPIRATORY_TRACT | Status: AC
  Administered 2012-03-21: 5 mg via RESPIRATORY_TRACT

## 2012-03-21 NOTE — ED Notes (Signed)
Pt c/o asthma exacerbation that began apprx. 3 days ago.

## 2012-03-22 ENCOUNTER — Emergency Department (HOSPITAL_BASED_OUTPATIENT_CLINIC_OR_DEPARTMENT_OTHER): Payer: Self-pay

## 2012-03-22 MED ORDER — BENZONATATE 100 MG PO CAPS: 100.0000 mg | ORAL_CAPSULE | Freq: Three times a day (TID) | ORAL | Status: DC

## 2012-03-22 MED ORDER — PREDNISONE 20 MG PO TABS: 40.0000 mg | ORAL_TABLET | Freq: Every day | ORAL | Status: DC

## 2012-03-22 MED ORDER — BENZONATATE 100 MG PO CAPS
200.0000 mg | ORAL_CAPSULE | Freq: Once | ORAL | Status: AC
  Administered 2012-03-22: 200 mg via ORAL
  Filled 2012-03-22: qty 2

## 2012-03-22 MED ORDER — ALBUTEROL SULFATE HFA 108 (90 BASE) MCG/ACT IN AERS: 2.0000 | INHALATION_SPRAY | RESPIRATORY_TRACT | Status: DC | PRN

## 2012-03-22 MED ORDER — PREDNISONE 20 MG PO TABS
40.0000 mg | ORAL_TABLET | Freq: Once | ORAL | Status: AC
Start: 2012-03-22 — End: 2012-03-22
  Administered 2012-03-22: 40 mg via ORAL
  Filled 2012-03-22: qty 2

## 2012-03-22 NOTE — ED Notes (Signed)
Pt reports feeling much better at this time. Feels like nebulizers helped. No longer wheezing. Discussed triggers with patient and making sure he has rescue inhaler available

## 2012-03-22 NOTE — ED Provider Notes (Signed)
History     CSN: [id number]  Arrival date & time 03/21/12  2323   First MD Initiated Contact with Patient 03/22/12 0015      Chief Complaint  Patient presents with  . Asthma    (Consider location/radiation/quality/duration/timing/severity/associated sxs/prior treatment) HPI Comments: 46 year old male with a history of hypertension, hyperlipidemia, ongoing tobacco abuse and asthma who presents with a complaint of wheezing and productive cough. He states that this started several days ago, gradually getting worse, feels like he is coughing almost constantly. He denies any fevers chills nausea vomiting back pain chest pain swelling of the legs. He has taken his albuterol inhaler until it has run out today and has taken 20 mg of prednisone earlier this morning but feels like his symptoms, worse this evening. On arrival he was given albuterol 5 mg nebulizer and feels like his symptoms have totally resolved. He is to coughing but his wheezing has improved significantly  Patient is a 46 y.o. male presenting with asthma. The history is provided by the patient.  Asthma Associated symptoms include shortness of breath. Pertinent negatives include no chest pain.    Past Medical History  Diagnosis Date  . Hypertension   . Unspecified asthma   . Hyperlipidemia   . Anxiety 12/30/2011    Past Surgical History  Procedure Date  . Cholecystectomy   . Right leg surgury     pin in right leg  . Right foot surgury apirl 2013    [facility]    Family History  Problem Relation Age of Onset  . Hypertension Father     History  Substance Use Topics  . Smoking status: Current Every Day Smoker  . Smokeless tobacco: Never Used  . Alcohol Use: Yes      Review of Systems  HENT: Negative for congestion, sore throat, rhinorrhea and postnasal drip.   Respiratory: Positive for cough, shortness of breath and wheezing.   Cardiovascular: Negative for chest pain.    Allergies  Acetaminophen and  Peanuts  Home Medications   Current Outpatient Rx  Name Route Sig Dispense Refill  . ALBUTEROL SULFATE HFA 108 (90 BASE) MCG/ACT IN AERS Inhalation Inhale 2 puffs into the lungs every 6 (six) hours as needed. ProAir. 1 Inhaler 11  . ALBUTEROL SULFATE HFA 108 (90 BASE) MCG/ACT IN AERS Inhalation Inhale 2 puffs into the lungs every 4 (four) hours as needed for wheezing or shortness of breath. 1 Inhaler 3  . AMLODIPINE-OLMESARTAN 5-40 MG PO TABS Oral Take 1 tablet by mouth daily. 90 tablet 3  . BENZONATATE 100 MG PO CAPS Oral Take 1 capsule (100 mg total) by mouth every 8 (eight) hours. 21 capsule 0  . BUDESONIDE-FORMOTEROL FUMARATE 160-4.5 MCG/ACT IN AERO Inhalation Inhale 2 puffs into the lungs 2 (two) times daily. 1 Inhaler 3  . CLONAZEPAM 1 MG PO TABS Oral Take 1 tablet (1 mg total) by mouth 2 (two) times daily as needed for anxiety. 60 tablet 0  . FEXOFENADINE HCL 180 MG PO TABS Oral Take 1 tablet (180 mg total) by mouth daily. 90 tablet 3  . FLUTICASONE PROPIONATE 50 MCG/ACT NA SUSP Nasal Place 2 sprays into the nose daily. 16 g 5  . ONE-A-DAY MENS PO TABS Oral Take 1 tablet by mouth daily.    Marland Kitchen NAPROXEN 500 MG PO TABS Oral Take 1 tablet (500 mg total) by mouth 2 (two) times daily with a meal. 60 tablet 2  . PREDNISONE 20 MG PO TABS Oral Take 2  tablets (40 mg total) by mouth daily. 10 tablet 0    BP 152/86  Pulse 90  Temp 97.9 F (36.6 C) (Oral)  Resp 20  SpO2 97%  Physical Exam  Nursing note and vitals reviewed. Constitutional: He appears well-developed and well-nourished. No distress.  HENT:  Head: Normocephalic and atraumatic.  Mouth/Throat: Oropharynx is clear and moist. No oropharyngeal exudate.  Eyes: Conjunctivae normal and EOM are normal. Pupils are equal, round, and reactive to light. Right eye exhibits no discharge. Left eye exhibits no discharge. No scleral icterus.  Neck: Normal range of motion. Neck supple. No JVD present. No thyromegaly present.  Cardiovascular:  Normal rate, regular rhythm, normal heart sounds and intact distal pulses.  Exam reveals no gallop and no friction rub.   No murmur heard. Pulmonary/Chest: Effort normal. No respiratory distress. He has wheezes. He has no rales.  Abdominal: Soft. Bowel sounds are normal. He exhibits no distension and no mass. There is no tenderness.  Musculoskeletal: Normal range of motion. He exhibits no edema and no tenderness.  Lymphadenopathy:    He has no cervical adenopathy.  Neurological: He is alert. Coordination normal.  Skin: Skin is warm and dry. No rash noted. No erythema.  Psychiatric: He has a normal mood and affect. His behavior is normal.    ED Course  Procedures (including critical care time)  Labs Reviewed - No data to display Dg Chest 2 View  03/22/2012  *RADIOLOGY REPORT*  Clinical Data: Cough, wheeze, asthma attack  CHEST - 2 VIEW  Comparison: 02/08/2012  Findings: Lungs are essentially clear. No pleural effusion or pneumothorax.  Cardiomediastinal silhouette is within normal limits.  Visualized osseous structures are within normal limits.  IMPRESSION: No evidence of acute cardiopulmonary disease.   Original Report Authenticated By: Charline Bills, M.D.      1. Asthma attack       MDM  At this time the patient has ongoing mild end expiratory wheezing but no other significant findings including no accessory muscle use, no increased work of breathing, no swelling of the lower extremities and normal vital signs except for mild hypertension. He has not had a fever and does not have any hypoxia. Chest x-ray pending at the patient's insistence to rule out pneumonia, prednisone given, albuterol nebulizer given, plan on sending home with albuterol MDI prescription along with prednisone and Tessalon.   Symptoms improved since medications given, chest x-ray reviewed and shows no signs of focal infiltrates. Patient stable for discharge   Discharge Prescriptions  include:   Tessalon  Prednisone  Albuterol MDI     Vida Roller, MD 03/22/12 0120

## 2012-03-22 NOTE — ED Notes (Signed)
MD at bedside. 

## 2012-03-25 ENCOUNTER — Other Ambulatory Visit: Payer: Self-pay

## 2012-03-25 MED ORDER — CLONAZEPAM 1 MG PO TABS: 1.0000 mg | ORAL_TABLET | Freq: Two times a day (BID) | ORAL | Status: DC | PRN

## 2012-03-25 NOTE — Telephone Encounter (Signed)
Faxed hardcopy to pharmacy. 

## 2012-03-25 NOTE — Telephone Encounter (Signed)
Done hardcopy to robin  

## 2012-05-01 ENCOUNTER — Emergency Department (HOSPITAL_BASED_OUTPATIENT_CLINIC_OR_DEPARTMENT_OTHER): Payer: Self-pay

## 2012-05-01 ENCOUNTER — Emergency Department (HOSPITAL_BASED_OUTPATIENT_CLINIC_OR_DEPARTMENT_OTHER)
Admission: EM | Admit: 2012-05-01 | Discharge: 2012-05-01 | Disposition: A | Discharge status: Home / Self Care | Payer: Self-pay | Attending: Emergency Medicine | Admitting: Emergency Medicine

## 2012-05-01 ENCOUNTER — Encounter (HOSPITAL_BASED_OUTPATIENT_CLINIC_OR_DEPARTMENT_OTHER): Payer: Self-pay | Admitting: Emergency Medicine

## 2012-05-01 DIAGNOSIS — Z79899 Other long term (current) drug therapy: Secondary | ICD-10-CM | POA: Insufficient documentation

## 2012-05-01 DIAGNOSIS — I10 Essential (primary) hypertension: Secondary | ICD-10-CM | POA: Insufficient documentation

## 2012-05-01 DIAGNOSIS — J45909 Unspecified asthma, uncomplicated: Secondary | ICD-10-CM | POA: Insufficient documentation

## 2012-05-01 DIAGNOSIS — E785 Hyperlipidemia, unspecified: Secondary | ICD-10-CM | POA: Insufficient documentation

## 2012-05-01 DIAGNOSIS — M25519 Pain in unspecified shoulder: Secondary | ICD-10-CM | POA: Insufficient documentation

## 2012-05-01 DIAGNOSIS — F17200 Nicotine dependence, unspecified, uncomplicated: Secondary | ICD-10-CM | POA: Insufficient documentation

## 2012-05-01 DIAGNOSIS — F411 Generalized anxiety disorder: Secondary | ICD-10-CM | POA: Insufficient documentation

## 2012-05-01 MED ORDER — IBUPROFEN 800 MG PO TABS: 800.0000 mg | ORAL_TABLET | Freq: Three times a day (TID) | ORAL | Status: DC

## 2012-05-01 MED ORDER — OXYCODONE-ACETAMINOPHEN 5-325 MG PO TABS
ORAL_TABLET | ORAL | Status: AC
  Filled 2012-05-01: qty 1

## 2012-05-01 MED ORDER — OXYCODONE HCL 5 MG PO TABS
5.0000 mg | ORAL_TABLET | Freq: Once | ORAL | Status: DC
  Filled 2012-05-01: qty 1

## 2012-05-01 MED ORDER — OXYCODONE HCL 5 MG PO CAPS: 5.0000 mg | ORAL_CAPSULE | Freq: Four times a day (QID) | ORAL | Status: DC | PRN

## 2012-05-01 MED ORDER — IBUPROFEN 800 MG PO TABS
800.0000 mg | ORAL_TABLET | Freq: Once | ORAL | Status: AC
Start: 2012-05-01 — End: 2012-05-01
  Administered 2012-05-01: 800 mg via ORAL
  Filled 2012-05-01: qty 1

## 2012-05-01 NOTE — ED Notes (Signed)
Pt c/o right shoulder pain, denies known injury. Pt states he thinks his shoulder is dislocated.

## 2012-05-01 NOTE — ED Notes (Signed)
MD at bedside. 

## 2012-05-01 NOTE — ED Notes (Signed)
Patient transported to X-ray 

## 2012-05-01 NOTE — ED Provider Notes (Signed)
History     CSN: [id number]  Arrival date & time 05/01/12  0221   First MD Initiated Contact with Patient 05/01/12 0231      Chief Complaint  Patient presents with  . Shoulder Pain    (Consider location/radiation/quality/duration/timing/severity/associated sxs/prior treatment) HPI Comments: Daniel Valencia presents ambulatory for evaluation of right shoulder pain.  He states he initially though he had slept wrong however the pain has persisted and he is now unable to lift the arm.  He also reports swelling now in his right hand.  He denies falling or any trauma.  He denies chest pain, SOB, or any hx of thromboembolic disease.  He states he has dislocated this shoulder previously but not recently.  He denies any other injuries.  Patient is a 46 y.o. male presenting with shoulder pain. The history is provided by the patient. No language interpreter was used.  Shoulder Pain This is a new problem. The current episode started 3 to 5 hours ago. The problem occurs constantly. The problem has been gradually worsening. Pertinent negatives include no chest pain, no abdominal pain, no headaches and no shortness of breath. The symptoms are aggravated by bending (Lifting the right arm). The symptoms are relieved by rest. He has tried nothing for the symptoms.    Past Medical History  Diagnosis Date  . Hypertension   . Unspecified asthma   . Hyperlipidemia   . Anxiety 12/30/2011    Past Surgical History  Procedure Date  . Cholecystectomy   . Right leg surgury     pin in right leg  . Right foot surgury apirl 2013    [facility]    Family History  Problem Relation Age of Onset  . Hypertension Father     History  Substance Use Topics  . Smoking status: Current Every Day Smoker  . Smokeless tobacco: Never Used  . Alcohol Use: Yes      Review of Systems  Respiratory: Negative for shortness of breath.   Cardiovascular: Negative for chest pain.  Gastrointestinal: Negative for abdominal  pain.  Neurological: Negative for headaches.  All other systems reviewed and are negative.    Allergies  Acetaminophen and Peanuts  Home Medications   Current Outpatient Rx  Name  Route  Sig  Dispense  Refill  . ALBUTEROL SULFATE HFA 108 (90 BASE) MCG/ACT IN AERS   Inhalation   Inhale 2 puffs into the lungs every 6 (six) hours as needed. ProAir.   1 Inhaler   11   . ALBUTEROL SULFATE HFA 108 (90 BASE) MCG/ACT IN AERS   Inhalation   Inhale 2 puffs into the lungs every 4 (four) hours as needed for wheezing or shortness of breath.   1 Inhaler   3   . AMLODIPINE-OLMESARTAN 5-40 MG PO TABS   Oral   Take 1 tablet by mouth daily.   90 tablet   3   . BENZONATATE 100 MG PO CAPS   Oral   Take 1 capsule (100 mg total) by mouth every 8 (eight) hours.   21 capsule   0   . BUDESONIDE-FORMOTEROL FUMARATE 160-4.5 MCG/ACT IN AERO   Inhalation   Inhale 2 puffs into the lungs 2 (two) times daily.   1 Inhaler   3   . CLONAZEPAM 1 MG PO TABS   Oral   Take 1 tablet (1 mg total) by mouth 2 (two) times daily as needed for anxiety.   60 tablet   1   .  FEXOFENADINE HCL 180 MG PO TABS   Oral   Take 1 tablet (180 mg total) by mouth daily.   90 tablet   3   . FLUTICASONE PROPIONATE 50 MCG/ACT NA SUSP   Nasal   Place 2 sprays into the nose daily.   16 g   5   . ONE-A-DAY MENS PO TABS   Oral   Take 1 tablet by mouth daily.         Marland Kitchen NAPROXEN 500 MG PO TABS   Oral   Take 1 tablet (500 mg total) by mouth 2 (two) times daily with a meal.   60 tablet   2   . PREDNISONE 20 MG PO TABS   Oral   Take 2 tablets (40 mg total) by mouth daily.   10 tablet   0     BP 140/92  Pulse 95  Temp 98.8 F (37.1 C) (Oral)  Resp 18  Ht 5\' 9"  (1.753 m)  Wt 195 lb (88.451 kg)  BMI 28.80 kg/m2  SpO2 97%  Physical Exam  Nursing note and vitals reviewed. Constitutional: He is oriented to person, place, and time. He appears well-developed and well-nourished. No distress.       Pt  appears neatly dressed and is pleasant and cooperative.  He smells of alcohol.   HENT:  Head: Normocephalic and atraumatic.  Right Ear: External ear normal.  Left Ear: External ear normal.  Nose: Nose normal.  Mouth/Throat: Oropharynx is clear and moist. No oropharyngeal exudate.  Eyes: Conjunctivae normal are normal. Pupils are equal, round, and reactive to light. Right eye exhibits no discharge. Left eye exhibits no discharge. No scleral icterus.  Neck: Normal range of motion. Neck supple. No JVD present. No tracheal deviation present.  Cardiovascular: Normal rate, regular rhythm, normal heart sounds and intact distal pulses.  Exam reveals no gallop and no friction rub.   No murmur heard. Pulmonary/Chest: Effort normal and breath sounds normal. No stridor. No respiratory distress. He has no wheezes. He has no rales. He exhibits no tenderness.  Abdominal: Soft. Bowel sounds are normal. He exhibits no distension and no mass. There is no tenderness. There is no rebound and no guarding.  Musculoskeletal: He exhibits tenderness. He exhibits no edema.       Right shoulder: He exhibits decreased range of motion, tenderness, bony tenderness, pain and spasm. He exhibits no swelling, no effusion, no crepitus, no deformity, no laceration, normal pulse and normal strength.       Arms: Lymphadenopathy:    He has no cervical adenopathy.  Neurological: He is alert and oriented to person, place, and time. No cranial nerve deficit.  Skin: Skin is warm and dry. No rash noted. He is not diaphoretic. No erythema. No pallor.  Psychiatric: He has a normal mood and affect. His behavior is normal.    ED Course  Procedures (including critical care time)  Labs Reviewed - No data to display No results found.   No diagnosis found.    MDM  Pt presents for evaluation of right anterior shoulder pain.  He denies any trauma, note no obvious deformity, stable, VS, NAD.  Pt has pain with palpation over the  humeral head and anterior shoulder without specific AC joint or scapular tenderness.  Will obtain x-rays of the shoulder and reassess.  His last meal was just prior to coming to the ER and he also smells of alcohol.  0350.  No dislocation noted on the x-rays.  The  humeral head appears intact and placed appropriately.  There is no AC separation.  Pt re-examined.  I appreciate no anterior fullness or palpable effusions.  There is "Minimal calcification adjacent to the lateral humeral head mayreflect mild calcific tendinitis".  Will place in a sling, provide pain mgmnt, and encourage close outpt follow-up with ortho.  He reports some swelling in his right hand.  Note no skin changes, swelling, or tenderness in the upper arm distal to the shoulder, the elbow, forearm, wrist, or hand.  There is no clinical evidence of a cellulitis, tenosynovitis, septic arthropathy, or compartment syndrome.        Daniel Chad, MD 05/01/12 [phone]

## 2012-05-01 NOTE — ED Notes (Signed)
md aware oxy ir not available

## 2012-05-01 NOTE — ED Notes (Signed)
Pt has noted swelling to right hand

## 2012-05-12 ENCOUNTER — Ambulatory Visit: Payer: Managed Care, Other (non HMO) | Admitting: Internal Medicine

## 2012-05-12 DIAGNOSIS — Z0289 Encounter for other administrative examinations: Secondary | ICD-10-CM

## 2012-05-14 ENCOUNTER — Other Ambulatory Visit: Payer: Self-pay | Admitting: *Deleted

## 2012-05-14 MED ORDER — NAPROXEN 500 MG PO TABS: 500.0000 mg | ORAL_TABLET | Freq: Two times a day (BID) | ORAL | Status: AC

## 2012-05-14 NOTE — Telephone Encounter (Signed)
Called informed the patient of MD instructions on medications.

## 2012-05-14 NOTE — Telephone Encounter (Signed)
Left msg on triage requesting refill on his oxycodone.../lmb 

## 2012-05-14 NOTE — Telephone Encounter (Signed)
Pt should not have need for ongoing narcotic med tx  Has recent MRI normal  OK for nsaid prn

## 2012-05-30 ENCOUNTER — Other Ambulatory Visit: Payer: Self-pay | Admitting: Internal Medicine

## 2012-05-30 NOTE — Telephone Encounter (Signed)
MD out of office pls advise.../lmb 

## 2012-05-30 NOTE — Telephone Encounter (Signed)
F[facility]ed [facility] to [facility].../lmb 

## 2012-07-03 ENCOUNTER — Encounter (HOSPITAL_BASED_OUTPATIENT_CLINIC_OR_DEPARTMENT_OTHER): Payer: Self-pay | Admitting: *Deleted

## 2012-07-03 ENCOUNTER — Emergency Department (HOSPITAL_BASED_OUTPATIENT_CLINIC_OR_DEPARTMENT_OTHER)
Admission: EM | Admit: 2012-07-03 | Discharge: 2012-07-03 | Disposition: A | Discharge status: Home / Self Care | Payer: Self-pay | Attending: Emergency Medicine | Admitting: Emergency Medicine

## 2012-07-03 DIAGNOSIS — I10 Essential (primary) hypertension: Secondary | ICD-10-CM | POA: Insufficient documentation

## 2012-07-03 DIAGNOSIS — E785 Hyperlipidemia, unspecified: Secondary | ICD-10-CM | POA: Insufficient documentation

## 2012-07-03 DIAGNOSIS — J45901 Unspecified asthma with (acute) exacerbation: Secondary | ICD-10-CM | POA: Insufficient documentation

## 2012-07-03 DIAGNOSIS — F17200 Nicotine dependence, unspecified, uncomplicated: Secondary | ICD-10-CM | POA: Insufficient documentation

## 2012-07-03 DIAGNOSIS — Z79899 Other long term (current) drug therapy: Secondary | ICD-10-CM | POA: Insufficient documentation

## 2012-07-03 DIAGNOSIS — F411 Generalized anxiety disorder: Secondary | ICD-10-CM | POA: Insufficient documentation

## 2012-07-03 MED ORDER — PREDNISONE 10 MG PO TABS: 50.0000 mg | ORAL_TABLET | Freq: Every day | ORAL | Status: DC

## 2012-07-03 MED ORDER — ALBUTEROL SULFATE (5 MG/ML) 0.5% IN NEBU
5.0000 mg | INHALATION_SOLUTION | Freq: Once | RESPIRATORY_TRACT | Status: AC
  Administered 2012-07-03: 5 mg via RESPIRATORY_TRACT
  Filled 2012-07-03: qty 1

## 2012-07-03 MED ORDER — PREDNISONE 50 MG PO TABS
60.0000 mg | ORAL_TABLET | Freq: Once | ORAL | Status: DC
  Filled 2012-07-03: qty 1

## 2012-07-03 MED ORDER — PREDNISONE 20 MG PO TABS
ORAL_TABLET | ORAL | Status: AC
  Administered 2012-07-03: 40 mg via ORAL
  Filled 2012-07-03: qty 2

## 2012-07-03 MED ORDER — ALBUTEROL SULFATE (5 MG/ML) 0.5% IN NEBU
INHALATION_SOLUTION | RESPIRATORY_TRACT | Status: AC
  Administered 2012-07-03: 5 mg via RESPIRATORY_TRACT
  Filled 2012-07-03: qty 1

## 2012-07-03 MED ORDER — PREDNISONE 20 MG PO TABS
40.0000 mg | ORAL_TABLET | Freq: Once | ORAL | Status: AC
  Administered 2012-07-03: 40 mg via ORAL

## 2012-07-03 MED ORDER — ALBUTEROL SULFATE (5 MG/ML) 0.5% IN NEBU
5.0000 mg | INHALATION_SOLUTION | Freq: Once | RESPIRATORY_TRACT | Status: AC
  Administered 2012-07-03: 5 mg via RESPIRATORY_TRACT

## 2012-07-03 MED ORDER — FLUTICASONE PROPIONATE HFA 110 MCG/ACT IN AERO: 1.0000 | INHALATION_SPRAY | Freq: Two times a day (BID) | RESPIRATORY_TRACT | Status: DC

## 2012-07-03 NOTE — ED Notes (Signed)
Pt c/o asthma attack that began this am. Pt cannot find his HHN.

## 2012-07-03 NOTE — ED Notes (Signed)
Pt took prednisone 20mg  prior to arrival.

## 2012-07-03 NOTE — ED Notes (Signed)
RT at bedside.

## 2012-07-03 NOTE — ED Provider Notes (Signed)
History     CSN: [id number]  Arrival date & time 07/03/12  4696   First MD Initiated Contact with Patient 07/03/12 0440      Chief Complaint  Patient presents with  . Asthma    (Consider location/radiation/quality/duration/timing/severity/associated sxs/prior treatment) Patient is a 47 y.o. male presenting with asthma and wheezing. The history is provided by the patient.  Asthma This is a recurrent problem. The current episode started less than 1 hour ago. The problem occurs constantly. The problem has not changed since onset.Pertinent negatives include no chest pain and no abdominal pain. The symptoms are aggravated by smoking (smooking a hookah). Nothing relieves the symptoms. He has tried nothing for the symptoms. The treatment provided no relief.  Wheezing  The current episode started today. The onset was sudden. The problem occurs continuously. The problem has been unchanged. The problem is moderate. Nothing relieves the symptoms. Nothing aggravates the symptoms. Associated symptoms include wheezing. Pertinent negatives include no chest pain and no cough. His past medical history is significant for asthma and past wheezing. He has been behaving normally. Urine output has been normal. The last void occurred less than 6 hours ago.    Past Medical History  Diagnosis Date  . Hypertension   . Unspecified asthma   . Hyperlipidemia   . Anxiety 12/30/2011    Past Surgical History  Procedure Date  . Cholecystectomy   . Right leg surgury     pin in right leg  . Right foot surgury apirl 2013    [facility]    Family History  Problem Relation Age of Onset  . Hypertension Father     History  Substance Use Topics  . Smoking status: Current Every Day Smoker  . Smokeless tobacco: Never Used  . Alcohol Use: Yes      Review of Systems  Respiratory: Positive for wheezing. Negative for cough and chest tightness.   Cardiovascular: Negative for chest pain.  Gastrointestinal:  Negative for abdominal pain.  All other systems reviewed and are negative.    Allergies  Acetaminophen and Peanuts  Home Medications   Current Outpatient Rx  Name  Route  Sig  Dispense  Refill  . ALBUTEROL SULFATE HFA 108 (90 BASE) MCG/ACT IN AERS   Inhalation   Inhale 2 puffs into the lungs every 6 (six) hours as needed. ProAir.   1 Inhaler   11   . ALBUTEROL SULFATE HFA 108 (90 BASE) MCG/ACT IN AERS   Inhalation   Inhale 2 puffs into the lungs every 4 (four) hours as needed for wheezing or shortness of breath.   1 Inhaler   3   . AMLODIPINE-OLMESARTAN 5-40 MG PO TABS   Oral   Take 1 tablet by mouth daily.   90 tablet   3   . BENZONATATE 100 MG PO CAPS   Oral   Take 1 capsule (100 mg total) by mouth every 8 (eight) hours.   21 capsule   0   . BUDESONIDE-FORMOTEROL FUMARATE 160-4.5 MCG/ACT IN AERO   Inhalation   Inhale 2 puffs into the lungs 2 (two) times daily.   1 Inhaler   3   . CLONAZEPAM 1 MG PO TABS      TAKE 1 TABLET BY MOUTH TWICE A DAY AS NEEDED FOR ANXIETY   60 tablet   1   . FEXOFENADINE HCL 180 MG PO TABS   Oral   Take 1 tablet (180 mg total) by mouth daily.   90  tablet   3   . FLUTICASONE PROPIONATE 50 MCG/ACT NA SUSP   Nasal   Place 2 sprays into the nose daily.   16 g   5   . IBUPROFEN 800 MG PO TABS   Oral   Take 1 tablet (800 mg total) by mouth 3 (three) times daily.   21 tablet   0   . ONE-A-DAY MENS PO TABS   Oral   Take 1 tablet by mouth daily.         Marland Kitchen NAPROXEN 500 MG PO TABS   Oral   Take 1 tablet (500 mg total) by mouth 2 (two) times daily with a meal.   60 tablet   5   . PREDNISONE 20 MG PO TABS   Oral   Take 2 tablets (40 mg total) by mouth daily.   10 tablet   0     BP 126/82  Pulse 122  Temp 98.2 F (36.8 C) (Oral)  Resp 22  Wt 196 lb (88.905 kg)  SpO2 97%  Physical Exam  Constitutional: He is oriented to person, place, and time. He appears well-developed and well-nourished. No distress.  HENT:   Head: Normocephalic and atraumatic.  Mouth/Throat: Oropharynx is clear and moist.  Eyes: Conjunctivae normal are normal. Pupils are equal, round, and reactive to light.  Neck: Normal range of motion. Neck supple.  Cardiovascular: Normal rate, regular rhythm and intact distal pulses.   Pulmonary/Chest: No stridor. No respiratory distress. He has wheezes. He has no rales. He exhibits no tenderness.  Abdominal: Soft. Bowel sounds are normal. There is no tenderness. There is no rebound and no guarding.  Musculoskeletal: Normal range of motion.  Neurological: He is alert and oriented to person, place, and time.  Skin: Skin is warm and dry.  Psychiatric: He has a normal mood and affect.    ED Course  Procedures (including critical care time)  Labs Reviewed - No data to display No results found.   No diagnosis found.    MDM  Sudden onset wheezing secondary to using a hookah.  No indication for labs or imaging.  Advised to stop tobacco use of any kind.  Rx for long term suppressant inhaler        Melah Ebling K Anthonio Mizzell-Rasch, MD 07/03/12 [phone]

## 2012-08-20 ENCOUNTER — Other Ambulatory Visit: Payer: Self-pay | Admitting: Internal Medicine

## 2012-08-21 NOTE — Telephone Encounter (Signed)
Faxed hardcopy to pharmacy. 

## 2012-08-21 NOTE — Telephone Encounter (Signed)
Done hardcopy to robin  

## 2012-08-21 NOTE — Telephone Encounter (Signed)
Last written 05/30/2012 #60 with 1 refill-please advise.

## 2012-09-01 ENCOUNTER — Ambulatory Visit: Payer: Managed Care, Other (non HMO) | Admitting: Internal Medicine

## 2012-09-01 DIAGNOSIS — Z0289 Encounter for other administrative examinations: Secondary | ICD-10-CM

## 2012-10-01 ENCOUNTER — Other Ambulatory Visit: Payer: Self-pay | Admitting: Internal Medicine

## 2012-10-01 NOTE — Telephone Encounter (Signed)
Klonopin too soon -

## 2012-10-08 ENCOUNTER — Ambulatory Visit: Payer: Managed Care, Other (non HMO) | Admitting: Internal Medicine

## 2012-10-13 ENCOUNTER — Ambulatory Visit: Payer: Managed Care, Other (non HMO) | Admitting: Internal Medicine

## 2012-10-13 DIAGNOSIS — Z0289 Encounter for other administrative examinations: Secondary | ICD-10-CM

## 2012-10-15 ENCOUNTER — Other Ambulatory Visit: Payer: Self-pay | Admitting: Internal Medicine

## 2012-10-16 ENCOUNTER — Telehealth: Payer: Self-pay | Admitting: Internal Medicine

## 2012-10-16 NOTE — Telephone Encounter (Signed)
Clonazepam has been called to pharmacy  

## 2012-10-16 NOTE — Telephone Encounter (Signed)
Dismissal Letter sent by Certified Mail on 10/16/2012  Received the Return Receipt showing someone picked up the Dismissal Letter 10/22/2012

## 2012-10-16 NOTE — Telephone Encounter (Signed)
Done hardcopy to robin  

## 2012-10-20 ENCOUNTER — Ambulatory Visit: Payer: Managed Care, Other (non HMO) | Admitting: Psychology

## 2012-11-17 ENCOUNTER — Other Ambulatory Visit: Payer: Self-pay | Admitting: Internal Medicine

## 2012-12-26 IMAGING — CR DG SHOULDER 2+V*R*
3 series · 3 of 3 positions shown · non-contrast
Comparison: Chest radiograph performed 03/22/2012

CLINICAL DATA: Severe anterior right shoulder pain, with swelling
of the right upper extremity.

RIGHT SHOULDER - 2+ VIEW

[w shoulder ap internal righ]
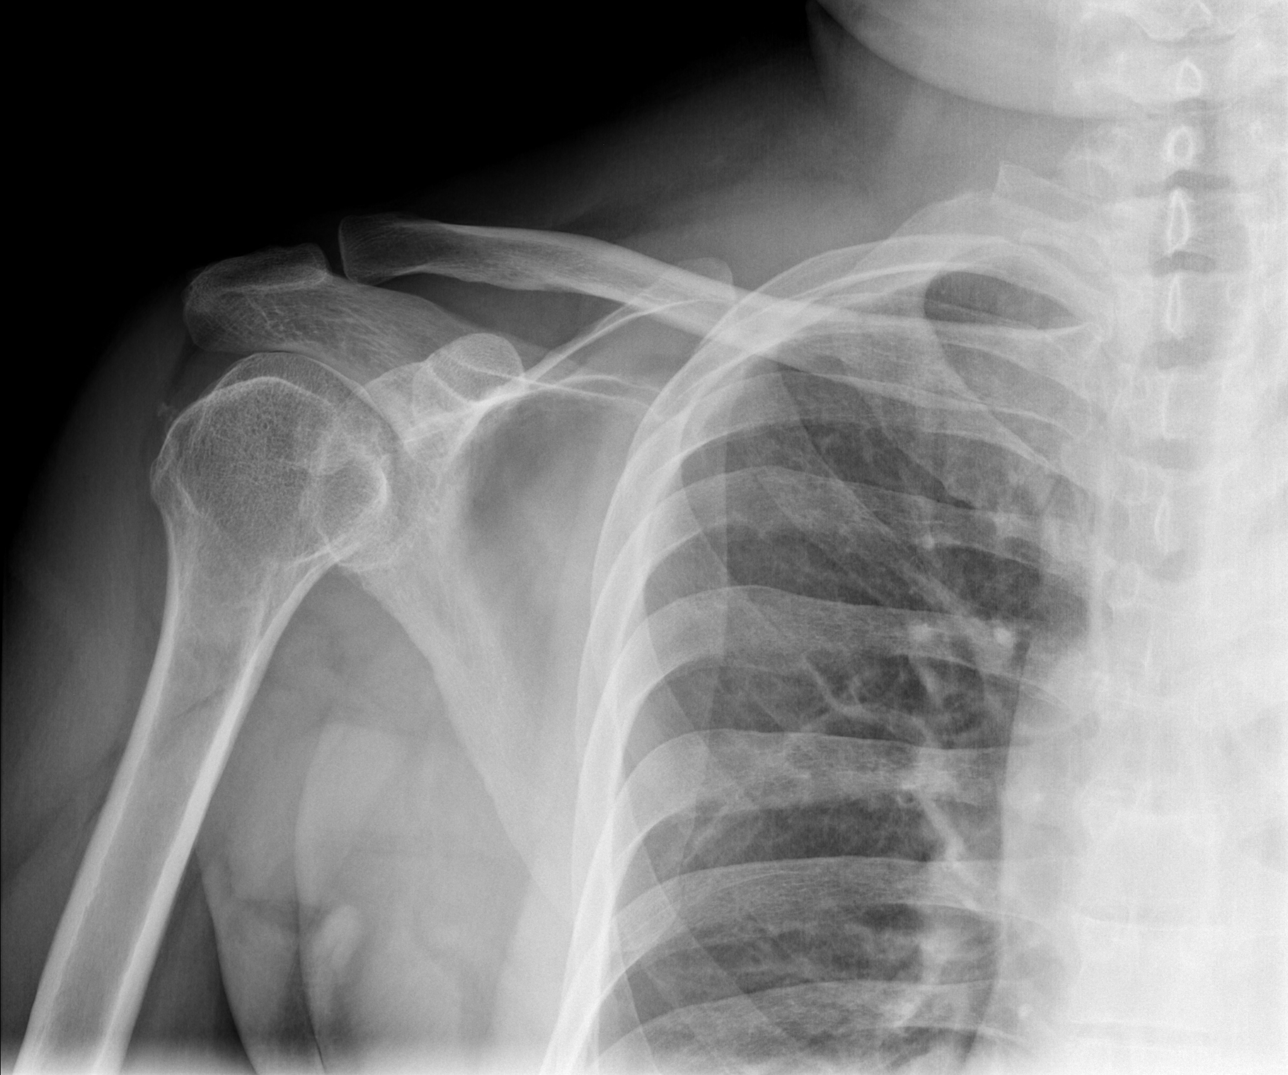

[w shoulder ap external righ]
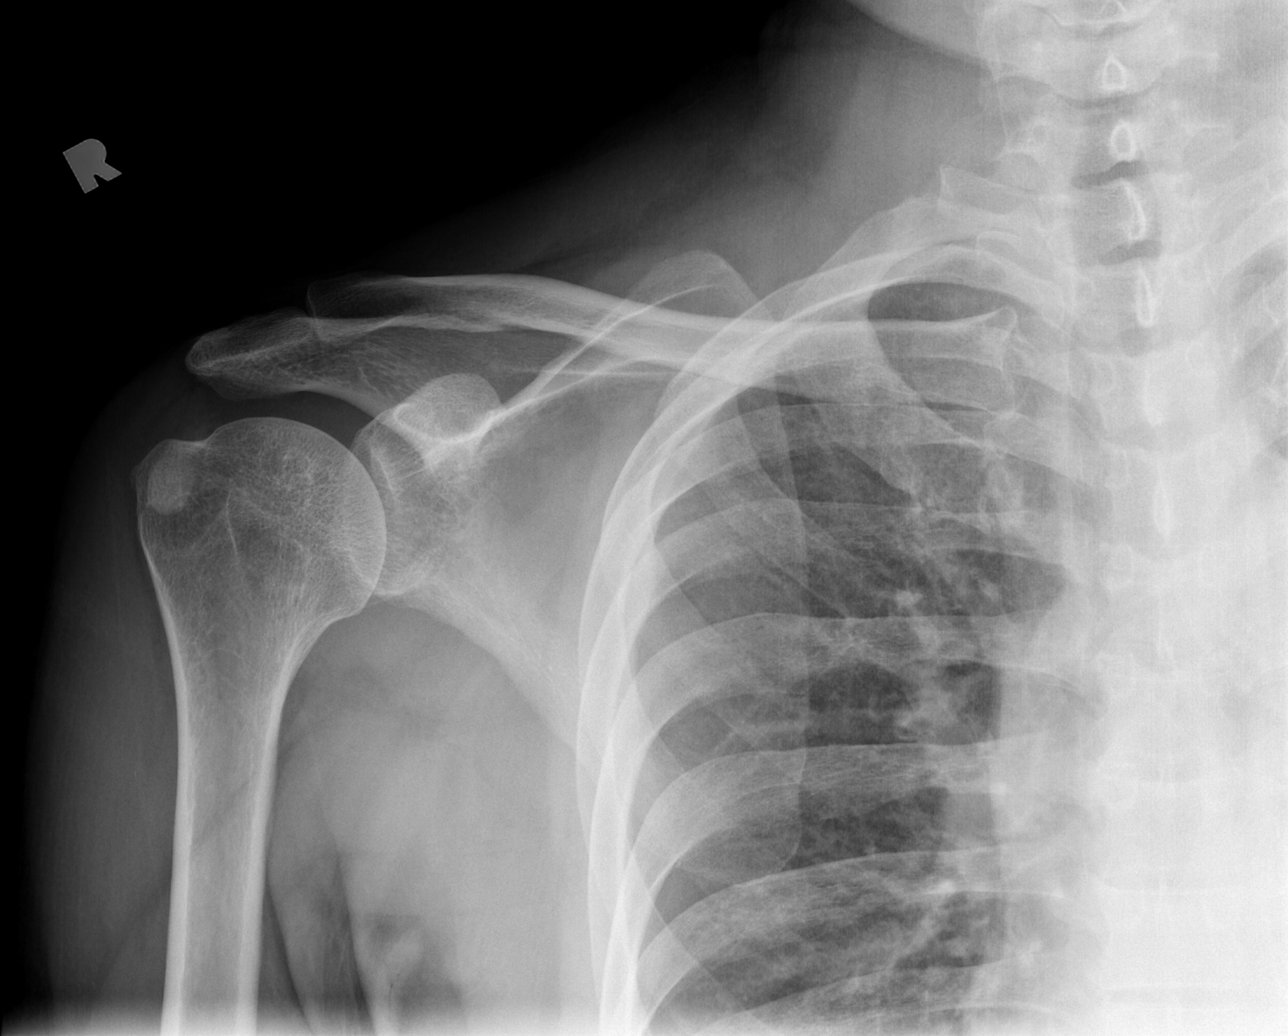

[w shoulder y view right]
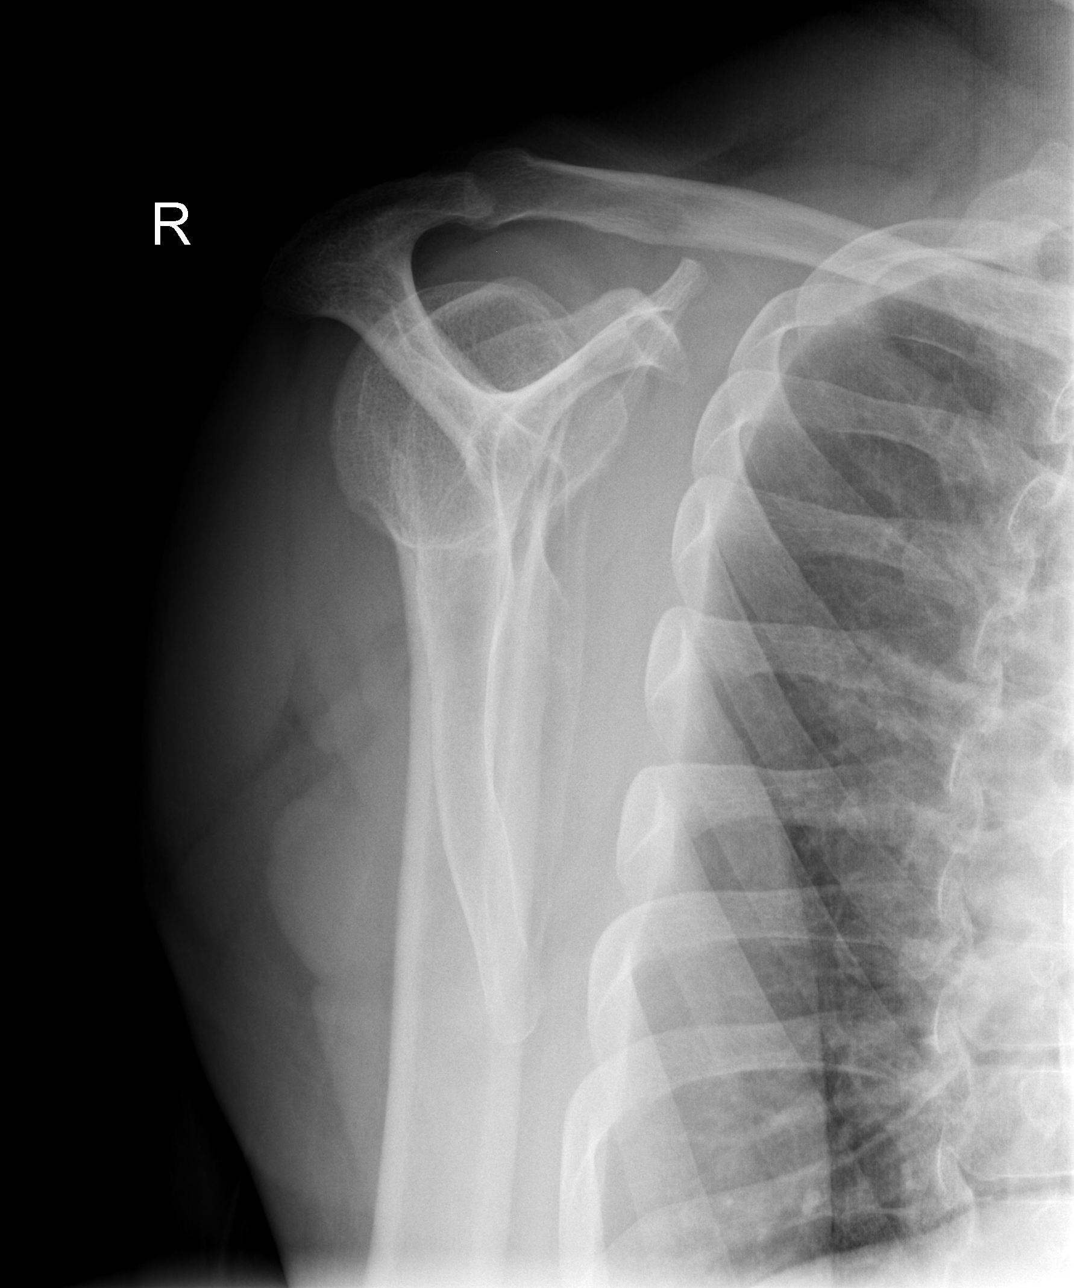

[3 of 3 positions shown; findings below may reference images not displayed]

FINDINGS: There is no evidence of fracture or dislocation.  An
apparent bone island is noted within the greater tuberosity;
minimal calcification adjacent to the lateral humeral head may
reflect mild calcific tendinitis, though this is not well
characterized on the externally rotated view.

The right humeral head is seated within the glenoid fossa.  The
acromioclavicular joint is unremarkable in appearance.  No
significant soft tissue abnormalities are seen.  The visualized
portions of the right lung are clear.
IMPRESSION: 1.  No evidence of fracture or dislocation.
2.  Minimal calcification adjacent to the lateral humeral head may
reflect mild calcific tendinitis.

## 2013-01-05 ENCOUNTER — Encounter (HOSPITAL_BASED_OUTPATIENT_CLINIC_OR_DEPARTMENT_OTHER): Payer: Self-pay | Admitting: *Deleted

## 2013-01-05 ENCOUNTER — Emergency Department (HOSPITAL_BASED_OUTPATIENT_CLINIC_OR_DEPARTMENT_OTHER)
Admission: EM | Admit: 2013-01-05 | Discharge: 2013-01-05 | Disposition: A | Discharge status: Home / Self Care | Payer: Self-pay | Attending: Emergency Medicine | Admitting: Emergency Medicine

## 2013-01-05 ENCOUNTER — Other Ambulatory Visit: Payer: Self-pay | Admitting: Internal Medicine

## 2013-01-05 DIAGNOSIS — F17200 Nicotine dependence, unspecified, uncomplicated: Secondary | ICD-10-CM | POA: Insufficient documentation

## 2013-01-05 DIAGNOSIS — J45901 Unspecified asthma with (acute) exacerbation: Secondary | ICD-10-CM

## 2013-01-05 DIAGNOSIS — Z79899 Other long term (current) drug therapy: Secondary | ICD-10-CM | POA: Insufficient documentation

## 2013-01-05 DIAGNOSIS — J45909 Unspecified asthma, uncomplicated: Secondary | ICD-10-CM | POA: Insufficient documentation

## 2013-01-05 DIAGNOSIS — F411 Generalized anxiety disorder: Secondary | ICD-10-CM | POA: Insufficient documentation

## 2013-01-05 DIAGNOSIS — Z862 Personal history of diseases of the blood and blood-forming organs and certain disorders involving the immune mechanism: Secondary | ICD-10-CM | POA: Insufficient documentation

## 2013-01-05 DIAGNOSIS — IMO0002 Reserved for concepts with insufficient information to code with codable children: Secondary | ICD-10-CM | POA: Insufficient documentation

## 2013-01-05 DIAGNOSIS — I10 Essential (primary) hypertension: Secondary | ICD-10-CM | POA: Insufficient documentation

## 2013-01-05 DIAGNOSIS — Z8639 Personal history of other endocrine, nutritional and metabolic disease: Secondary | ICD-10-CM | POA: Insufficient documentation

## 2013-01-05 MED ORDER — ALBUTEROL SULFATE HFA 108 (90 BASE) MCG/ACT IN AERS: 2.0000 | INHALATION_SPRAY | RESPIRATORY_TRACT | Status: DC | PRN

## 2013-01-05 MED ORDER — PREDNISONE 50 MG PO TABS
60.0000 mg | ORAL_TABLET | Freq: Once | ORAL | Status: AC
  Administered 2013-01-05: 60 mg via ORAL
  Filled 2013-01-05: qty 1

## 2013-01-05 MED ORDER — PREDNISONE 10 MG PO TABS: ORAL_TABLET | ORAL | Status: DC

## 2013-01-05 MED ORDER — ALBUTEROL SULFATE HFA 108 (90 BASE) MCG/ACT IN AERS
2.0000 | INHALATION_SPRAY | Freq: Once | RESPIRATORY_TRACT | Status: AC
  Administered 2013-01-05: 2 via RESPIRATORY_TRACT
  Filled 2013-01-05: qty 6.7

## 2013-01-05 MED ORDER — ALBUTEROL SULFATE (5 MG/ML) 0.5% IN NEBU
5.0000 mg | INHALATION_SOLUTION | Freq: Once | RESPIRATORY_TRACT | Status: AC
  Administered 2013-01-05: 5 mg via RESPIRATORY_TRACT
  Filled 2013-01-05: qty 1

## 2013-01-05 MED ORDER — AMLODIPINE-OLMESARTAN 5-40 MG PO TABS: 1.0000 | ORAL_TABLET | Freq: Every day | ORAL | Status: DC

## 2013-01-05 NOTE — ED Notes (Addendum)
Asthma. Ran out of his inhaler 2 days ago and his MD is on vacation. He needs a Rx refill for his BP medication also.

## 2013-01-05 NOTE — ED Provider Notes (Signed)
CSN: [id number]     Arrival date & time 01/05/13  2022 History     First MD Initiated Contact with Patient 01/05/13 2033     Chief Complaint  Patient presents with  . Asthma   (Consider location/radiation/quality/duration/timing/severity/associated sxs/prior Treatment) Patient is a 47 y.o. male presenting with asthma. The history is provided by the patient. No language interpreter was used.  Asthma This is a new problem. The current episode started today. The problem occurs constantly. The problem has been gradually worsening. Nothing aggravates the symptoms. He has tried nothing for the symptoms. The treatment provided moderate relief.    Past Medical History  Diagnosis Date  . Hypertension   . Unspecified asthma(493.90)   . Hyperlipidemia   . Anxiety 12/30/2011   Past Surgical History  Procedure Laterality Date  . Cholecystectomy    . Right leg surgury      pin in right leg  . Right foot surgury  apirl 2013    [facility]   Family History  Problem Relation Age of Onset  . Hypertension Father    History  Substance Use Topics  . Smoking status: Current Every Day Smoker -- 0.50 packs/day    Types: Cigarettes  . Smokeless tobacco: Never Used  . Alcohol Use: Yes    Review of Systems  All other systems reviewed and are negative.    Allergies  Acetaminophen and Peanuts  Home Medications   Current Outpatient Rx  Name  Route  Sig  Dispense  Refill  . albuterol (PROVENTIL HFA;VENTOLIN HFA) 108 (90 BASE) MCG/ACT inhaler   Inhalation   Inhale 2 puffs into the lungs every 6 (six) hours as needed. ProAir.   1 Inhaler   11   . albuterol (PROVENTIL HFA;VENTOLIN HFA) 108 (90 BASE) MCG/ACT inhaler   Inhalation   Inhale 2 puffs into the lungs every 4 (four) hours as needed for wheezing.   1 Inhaler   1   . amLODipine-olmesartan (AZOR) 5-40 MG per tablet   Oral   Take 1 tablet by mouth daily.   30 tablet   1   . benzonatate (TESSALON) 100 MG capsule   Oral  Take 1 capsule (100 mg total) by mouth every 8 (eight) hours.   21 capsule   0   . budesonide-formoterol (SYMBICORT) 160-4.5 MCG/ACT inhaler   Inhalation   Inhale 2 puffs into the lungs 2 (two) times daily.   1 Inhaler   3   . clonazePAM (KLONOPIN) 1 MG tablet      TAKE 1 TABLET BY MOUTH TWICE DAILY AS NEEDED FOR ANXIETY   60 tablet   0   . fexofenadine (ALLEGRA) 180 MG tablet   Oral   Take 1 tablet (180 mg total) by mouth daily.   90 tablet   3   . fluticasone (FLONASE) 50 MCG/ACT nasal spray   Nasal   Place 2 sprays into the nose daily.   16 g   5   . fluticasone (FLOVENT HFA) 110 MCG/ACT inhaler   Inhalation   Inhale 1 puff into the lungs 2 (two) times daily.   1 Inhaler   0   . ibuprofen (ADVIL,MOTRIN) 800 MG tablet   Oral   Take 1 tablet (800 mg total) by mouth 3 (three) times daily.   21 tablet   0   . multivitamin (ONE-A-DAY MEN'S) TABS   Oral   Take 1 tablet by mouth daily.         Marland Kitchen  naproxen (NAPROSYN) 500 MG tablet   Oral   Take 1 tablet (500 mg total) by mouth 2 (two) times daily with a meal.   60 tablet   5   . predniSONE (DELTASONE) 10 MG tablet      6 tablets a day for 3 days   15 tablet   0   . predniSONE (DELTASONE) 20 MG tablet   Oral   Take 2 tablets (40 mg total) by mouth daily.   10 tablet   0    BP 148/105  Pulse 91  Temp(Src) 98.6 F (37 C) (Oral)  Resp 20  Ht 5\' 9"  (1.753 m)  Wt 190 lb (86.183 kg)  BMI 28.05 kg/m2  SpO2 98% Physical Exam  Nursing note and vitals reviewed. Constitutional: He appears well-developed and well-nourished.  HENT:  Head: Normocephalic and atraumatic.  Eyes: Pupils are equal, round, and reactive to light.  Neck: Normal range of motion. Neck supple.  Cardiovascular: Normal rate.   Pulmonary/Chest: He has wheezes.  Abdominal: Soft. Bowel sounds are normal.  Musculoskeletal: Normal range of motion.  Neurological: He is alert.  Skin: Skin is warm.  Psychiatric: He has a normal mood and  affect.    ED Course   Procedures (including critical care time)  Labs Reviewed - No data to display No results found. 1. Asthma attack    Pt feels better after treatment.   Pt given and inhaler here and rx for inhaler MDM  Prednisone x 3 days,  Albuterol inhaler to go.   Pt request refill of his azor.    Pt advised follow up with his Md for recheck in 1 week  Elson Areas, [place] 01/05/13 2140

## 2013-01-05 NOTE — ED Notes (Signed)
Pt also requesting rx for Azor since he is been out x 2 days.

## 2013-01-06 NOTE — ED Provider Notes (Signed)
Medical screening examination/treatment/procedure(s) were performed by non-physician practitioner and as supervising physician I was immediately available for consultation/collaboration.   [name], MD 01/06/13 0024 

## 2013-04-27 ENCOUNTER — Emergency Department (INDEPENDENT_AMBULATORY_CARE_PROVIDER_SITE_OTHER)
Admission: EM | Admit: 2013-04-27 | Discharge: 2013-04-27 | Disposition: A | Discharge status: Home / Self Care | Payer: Self-pay | Source: Home / Self Care | Attending: Emergency Medicine | Admitting: Emergency Medicine

## 2013-04-27 ENCOUNTER — Encounter (HOSPITAL_COMMUNITY): Payer: Self-pay | Admitting: Emergency Medicine

## 2013-04-27 DIAGNOSIS — J209 Acute bronchitis, unspecified: Secondary | ICD-10-CM

## 2013-04-27 DIAGNOSIS — J45909 Unspecified asthma, uncomplicated: Secondary | ICD-10-CM

## 2013-04-27 HISTORY — DX: Pure hypercholesterolemia, unspecified: E78.00

## 2013-04-27 MED ORDER — ALBUTEROL SULFATE (5 MG/ML) 0.5% IN NEBU
5.0000 mg | INHALATION_SOLUTION | Freq: Once | RESPIRATORY_TRACT | Status: AC
  Administered 2013-04-27: 5 mg via RESPIRATORY_TRACT

## 2013-04-27 MED ORDER — PREDNISONE 20 MG PO TABS
ORAL_TABLET | ORAL | Status: AC
  Filled 2013-04-27: qty 3

## 2013-04-27 MED ORDER — AMOXICILLIN-POT CLAVULANATE 875-125 MG PO TABS: 1.0000 | ORAL_TABLET | Freq: Two times a day (BID) | ORAL | Status: DC

## 2013-04-27 MED ORDER — IPRATROPIUM BROMIDE 0.02 % IN SOLN
0.5000 mg | Freq: Once | RESPIRATORY_TRACT | Status: AC
  Administered 2013-04-27: 0.5 mg via RESPIRATORY_TRACT

## 2013-04-27 MED ORDER — BECLOMETHASONE DIPROPIONATE 80 MCG/ACT IN AERS: 2.0000 | INHALATION_SPRAY | Freq: Two times a day (BID) | RESPIRATORY_TRACT | Status: DC

## 2013-04-27 MED ORDER — PREDNISONE 20 MG PO TABS: 20.0000 mg | ORAL_TABLET | Freq: Two times a day (BID) | ORAL | Status: DC

## 2013-04-27 MED ORDER — ALBUTEROL SULFATE (5 MG/ML) 0.5% IN NEBU
INHALATION_SOLUTION | RESPIRATORY_TRACT | Status: AC
  Filled 2013-04-27: qty 1

## 2013-04-27 MED ORDER — IPRATROPIUM BROMIDE 0.02 % IN SOLN
RESPIRATORY_TRACT | Status: AC
  Filled 2013-04-27: qty 2.5

## 2013-04-27 MED ORDER — ALBUTEROL SULFATE HFA 108 (90 BASE) MCG/ACT IN AERS: 1.0000 | INHALATION_SPRAY | Freq: Four times a day (QID) | RESPIRATORY_TRACT | Status: DC | PRN

## 2013-04-27 MED ORDER — HYDROCOD POLST-CHLORPHEN POLST 10-8 MG/5ML PO LQCR: 5.0000 mL | Freq: Two times a day (BID) | ORAL | Status: DC | PRN

## 2013-04-27 MED ORDER — PREDNISONE 20 MG PO TABS
60.0000 mg | ORAL_TABLET | Freq: Once | ORAL | Status: AC
  Administered 2013-04-27: 60 mg via ORAL

## 2013-04-27 NOTE — ED Notes (Signed)
Cough for 2 weeks, patient has a history of asthma.  Patient reports during the day, he has a productive cough. At night he suffers with a dry cough, hard cough, that seems to trigger his asthma, and cough leaves him hurting all over after coughing so hard

## 2013-04-27 NOTE — ED Notes (Signed)
Reprinted instructions-incomplete instructions initially

## 2013-04-27 NOTE — ED Provider Notes (Signed)
Chief Complaint:   Chief Complaint  Patient presents with  . 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 The past 2 weeks she's had a cough which is sometimes dry and sometimes productive of small amounts of clear sputum and he's also had some wheezing, aching in his chest, sore throat, nasal congestion, postnasal drip, headache, itchy, watery eyes. He denies any fever, chills, or GI symptoms. He uses when necessary albuterol and estimates he uses this normally about once or twice a month, but since he's been sick for the past 2 weeks, he's been using an several times every day.  Review of Systems:  Other than noted above, the patient denies any of the following symptoms. Systemic:  No fever, chills, sweats, fatigue, myalgias, headache, weight loss or anorexia. ENT:  No earache, ear congestion, nasal congestion, sneezing, rhinorrhea, sinus pressure, sinus pain, post nasal drip, or sore throat. Lungs:  No cough, sputum production, or shortness of breath. No chest pain. Skin:  No rash or itching.  PMFSH:  Past medical history, family history, social history, meds, and allergies were reviewed.  No history of allergic rhinitis.  No use of tobacco. He has no medication allergies. His only medications are albuterol and he was in a store 5/40 for high blood pressure. He has hypertension, asthma, allergies, elevated cholesterol. He does not have a family physician.  Physical Exam:   Vital signs:  BP 137/87  Pulse 80  Temp(Src) 97.7 F (36.5 C) (Oral)  Resp 16  SpO2 100% General:  Alert, in no distress. Eye:  No conjunctival injection or drainage. Lids were normal. ENT:  TMs and canals were normal, without erythema or inflammation.  Nasal mucosa was clear and uncongested, without drainage.  Mucous membranes were moist.  Pharynx was clear, without exudate or drainage.   There were no oral ulcerations or lesions. Neck:  Supple, no adenopathy, tenderness or mass. Lungs:  No retractions or use of accessory muscles.  No respiratory distress.  He has bilateral expiratory wheezes heard only posteriorly, not anteriorly, no rales or rhonchi, good air movement. Heart:  Regular rhythm, without gallops, murmers or rubs. Skin:  Clear, warm, and dry, without rash or lesions.  Course in Urgent Care Center:   Given prednisone 60 mg by mouth and a DuoNeb breathing treatment. He felt a lot better after that and his lungs were clear with only minimal wheezes.  Assessment:  The primary encounter diagnosis was Acute bronchitis. A diagnosis of Asthma was also pertinent to this visit.  Plan:   1.  Meds:  The following meds were prescribed:   New Prescriptions   ALBUTEROL (PROVENTIL HFA;VENTOLIN HFA) 108 (90 BASE) MCG/ACT INHALER    Inhale 1-2 puffs into the lungs every 6 (six) hours as needed for wheezing or shortness of breath.   AMOXICILLIN-CLAVULANATE (AUGMENTIN) 875-125 MG PER TABLET    Take 1 tablet by mouth 2 (two) times daily.   BECLOMETHASONE (QVAR) 80 MCG/ACT INHALER    Inhale 2 puffs into the lungs 2 (two) times daily.   CHLORPHENIRAMINE-HYDROCODONE (TUSSIONEX) 10-8 MG/5ML LQCR    Take 5 mLs by mouth every 12 (twelve) hours as needed for cough.   PREDNISONE (DELTASONE) 20 MG TABLET    Take 1 tablet (20 mg total) by mouth 2 (two) times daily.    2.  Patient Education/Counseling:  The  patient was given appropriate handouts, self care instructions, and instructed in symptomatic relief.  Recommended he followup with primary care physician.  3.  Follow up:  The patient was told to follow up if no better in 3 to 4 days, if becoming worse in any way, and given some red flag symptoms such as worsening difficulty breathing which would prompt immediate return.  Follow up with Dr. Maryelizabeth Rowan in about one month.       Reuben Likes, MD 04/27/13 [phone]

## 2013-04-28 ENCOUNTER — Ambulatory Visit: Payer: Self-pay | Admitting: Podiatry

## 2013-06-01 ENCOUNTER — Encounter (HOSPITAL_COMMUNITY): Payer: Self-pay | Admitting: Emergency Medicine

## 2013-06-01 ENCOUNTER — Emergency Department (INDEPENDENT_AMBULATORY_CARE_PROVIDER_SITE_OTHER)
Admission: EM | Admit: 2013-06-01 | Discharge: 2013-06-01 | Disposition: A | Discharge status: Home / Self Care | Payer: Self-pay | Source: Home / Self Care | Attending: Emergency Medicine | Admitting: Emergency Medicine

## 2013-06-01 DIAGNOSIS — J45909 Unspecified asthma, uncomplicated: Secondary | ICD-10-CM

## 2013-06-01 DIAGNOSIS — J069 Acute upper respiratory infection, unspecified: Secondary | ICD-10-CM

## 2013-06-01 DIAGNOSIS — I10 Essential (primary) hypertension: Secondary | ICD-10-CM

## 2013-06-01 DIAGNOSIS — H6690 Otitis media, unspecified, unspecified ear: Secondary | ICD-10-CM

## 2013-06-01 DIAGNOSIS — H6692 Otitis media, unspecified, left ear: Secondary | ICD-10-CM

## 2013-06-01 LAB — POCT RAPID STREP A: Streptococcus, Group A Screen (Direct): NEGATIVE

## 2013-06-01 MED ORDER — HYDROCOD POLST-CHLORPHEN POLST 10-8 MG/5ML PO LQCR: 5.0000 mL | Freq: Two times a day (BID) | ORAL | Status: DC | PRN

## 2013-06-01 MED ORDER — AMLODIPINE-OLMESARTAN 5-40 MG PO TABS: 1.0000 | ORAL_TABLET | Freq: Every day | ORAL | Status: DC

## 2013-06-01 MED ORDER — PREDNISONE 20 MG PO TABS: 20.0000 mg | ORAL_TABLET | Freq: Two times a day (BID) | ORAL | Status: DC

## 2013-06-01 MED ORDER — AMOXICILLIN 500 MG PO CAPS: 1000.0000 mg | ORAL_CAPSULE | Freq: Three times a day (TID) | ORAL | Status: DC

## 2013-06-01 MED ORDER — ALBUTEROL SULFATE HFA 108 (90 BASE) MCG/ACT IN AERS: 1.0000 | INHALATION_SPRAY | Freq: Four times a day (QID) | RESPIRATORY_TRACT | Status: DC | PRN

## 2013-06-01 NOTE — ED Notes (Signed)
C/o cough. Sore throat. Sob. Hx of asthma.  X 2 days.  Denies fever, n/v/d.    Pt also out of BP meds for 2 wks.  Needs refill on azor 5-40

## 2013-06-01 NOTE — ED Notes (Signed)
Waiting discharge papers 

## 2013-06-01 NOTE — ED Provider Notes (Signed)
Chief Complaint   Chief Complaint  Patient presents with  . URI  . 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 He presents with a 2 to three-day history of URI symptoms with sore throat, cough which is mostly dry but sometimes productive yellow sputum, chest pain, wheezing, subjective fever, nasal congestion with yellow-green drainage, and headache. He has asthma which is controlled with albuterol inhaler. He also would like a refill on his blood pressure pill, amlodipine/olnesartan.  Review of Systems   Other than as noted above, the patient denies any of the following symptoms: Systemic:  No fevers, chills, sweats, or myalgias. Eye:  No redness or discharge. ENT:  No ear pain, headache, nasal congestion, drainage, sinus pressure, or sore throat. Neck:  No neck pain, stiffness, or swollen glands. Lungs:  No cough, sputum production, hemoptysis, wheezing, chest tightness, shortness of breath or chest pain. GI:  No abdominal pain, nausea, vomiting or diarrhea.  PMFSH   Past medical history, family history, social history, meds, and allergies were reviewed. He has asthma and high blood pressure. He takes albuterol and Azor.  Physical exam   Vital signs:  BP 158/103  Pulse 82  Temp(Src) 98.1 F (36.7 C) (Oral)  Resp 16  Ht 5\' 9"  (1.753 m)  Wt 190 lb (86.183 kg)  BMI 28.05 kg/m2  SpO2 99% General:  Alert and oriented.  In no distress.  Skin warm and dry. Eye:  No conjunctival injection or drainage. Lids were normal. ENT:  His left TM was slightly erythematous, right TM was normal.  Nasal mucosa was clear and uncongested, without drainage.  Mucous membranes were moist.  Pharynx was clear with no exudate or drainage.  There were no oral ulcerations or lesions. Neck:  Supple, no adenopathy, tenderness or mass. Lungs:  No respiratory distress.  Lungs were clear to auscultation, without wheezes, rales or  rhonchi.  Breath sounds were clear and equal bilaterally.  Heart:  Regular rhythm, without gallops, murmers or rubs. Skin:  Clear, warm, and dry, without rash or lesions.   Labs   Results for orders placed during the hospital encounter of 06/01/13  POCT RAPID STREP A ([facility] ONLY)      Result Value Range   Streptococcus, Group A Screen (Direct) NEGATIVE  NEGATIVE   Assessment     The primary encounter diagnosis was Viral upper respiratory infection. Diagnoses of Left otitis media, Asthma, and Hypertension were also pertinent to this visit.  Plan    1.  Meds:  The following meds were prescribed:   Discharge Medication List as of 06/01/2013 11:26 AM    START taking these medications   Details  !! albuterol (PROVENTIL HFA;VENTOLIN HFA) 108 (90 BASE) MCG/ACT inhaler Inhale 1-2 puffs into the lungs every 6 (six) hours as needed for wheezing or shortness of breath., Starting 06/01/2013, Until Discontinued, Normal    !! amLODipine-olmesartan (AZOR) 5-40 MG per tablet Take 1 tablet by mouth daily., Starting 06/01/2013, Until Discontinued, Normal    amoxicillin (AMOXIL) 500 MG capsule Take 2 capsules (1,000 mg total) by mouth 3 (three) times daily., Starting 06/01/2013, Until Discontinued, Normal    !! chlorpheniramine-HYDROcodone (TUSSIONEX) 10-8 MG/5ML LQCR Take 5 mLs by mouth every 12 (twelve) hours as needed for cough., Starting 06/01/2013, Until Discontinued, Normal    !! predniSONE (DELTASONE) 20 MG tablet Take 1 tablet (20 mg total) by mouth 2 (two)  times daily., Starting 06/01/2013, Until Discontinued, Normal     !! - Potential duplicate medications found. Please discuss with provider.      2.  Patient Education/Counseling:  The patient was given appropriate handouts, self care instructions, and instructed in symptomatic relief.  Instructed to get extra fluids, rest, and use a cool mist vaporizer.    3.  Follow up:  The patient was told to follow up here if no better in 3  to 4 days, or sooner if becoming worse in any way, and given some red flag symptoms such as increasing fever, difficulty breathing, chest pain, or persistent vomiting which would prompt immediate return.  Follow up here as needed.      Reuben Likes, MD 06/01/13 [phone]

## 2013-06-03 LAB — CULTURE, GROUP A STREP

## 2013-07-07 ENCOUNTER — Encounter: Payer: Self-pay | Admitting: Podiatry

## 2013-07-07 ENCOUNTER — Ambulatory Visit (INDEPENDENT_AMBULATORY_CARE_PROVIDER_SITE_OTHER): Payer: Self-pay | Admitting: Podiatry

## 2013-07-07 VITALS — BP 147/101 | HR 94 | Ht 69.0 in | Wt 190.0 lb

## 2013-07-07 DIAGNOSIS — M25579 Pain in unspecified ankle and joints of unspecified foot: Secondary | ICD-10-CM

## 2013-07-07 DIAGNOSIS — M25571 Pain in right ankle and joints of right foot: Secondary | ICD-10-CM

## 2013-07-07 DIAGNOSIS — L600 Ingrowing nail: Secondary | ICD-10-CM

## 2013-07-07 NOTE — Patient Instructions (Signed)
Seen for painful ingrown nail. Noted of deformed and callus build nail groove right great toe.  Will remove nail and nail matrix from the area this Friday.

## 2013-07-07 NOTE — Progress Notes (Signed)
Subjective: Pain in right great toe nail that has had surgery done a year ago for ingrown nail. At this time pain is at 6-7 of 10 without shoe. Pain is too much to wear closed in shoes.   Objective: Thick deformed nail both great toes with pain on right. Neurovascular status within normal. No gross deformities in osseous structures.  Assessment: Onychomycosis x 10. Onychocryptosis both great toe R>L.  Plan:  Debrided all nails. May benefit from Excision nail and nail matrix right hallux medial border.

## 2013-07-10 ENCOUNTER — Ambulatory Visit: Payer: BC Managed Care – PPO | Admitting: Podiatry

## 2013-07-17 ENCOUNTER — Encounter (HOSPITAL_BASED_OUTPATIENT_CLINIC_OR_DEPARTMENT_OTHER): Payer: Self-pay | Admitting: Emergency Medicine

## 2013-07-17 ENCOUNTER — Emergency Department (HOSPITAL_BASED_OUTPATIENT_CLINIC_OR_DEPARTMENT_OTHER): Payer: BLUE CROSS/BLUE SHIELD

## 2013-07-17 ENCOUNTER — Emergency Department (HOSPITAL_BASED_OUTPATIENT_CLINIC_OR_DEPARTMENT_OTHER)
Admission: EM | Admit: 2013-07-17 | Discharge: 2013-07-17 | Disposition: A | Discharge status: Home / Self Care | Payer: BLUE CROSS/BLUE SHIELD | Attending: Emergency Medicine | Admitting: Emergency Medicine

## 2013-07-17 DIAGNOSIS — J069 Acute upper respiratory infection, unspecified: Secondary | ICD-10-CM | POA: Diagnosis not present

## 2013-07-17 DIAGNOSIS — Z8659 Personal history of other mental and behavioral disorders: Secondary | ICD-10-CM | POA: Insufficient documentation

## 2013-07-17 DIAGNOSIS — IMO0001 Reserved for inherently not codable concepts without codable children: Secondary | ICD-10-CM | POA: Insufficient documentation

## 2013-07-17 DIAGNOSIS — Z79899 Other long term (current) drug therapy: Secondary | ICD-10-CM | POA: Insufficient documentation

## 2013-07-17 DIAGNOSIS — J029 Acute pharyngitis, unspecified: Secondary | ICD-10-CM | POA: Diagnosis present

## 2013-07-17 DIAGNOSIS — F17200 Nicotine dependence, unspecified, uncomplicated: Secondary | ICD-10-CM | POA: Diagnosis not present

## 2013-07-17 DIAGNOSIS — E785 Hyperlipidemia, unspecified: Secondary | ICD-10-CM | POA: Insufficient documentation

## 2013-07-17 DIAGNOSIS — R52 Pain, unspecified: Secondary | ICD-10-CM | POA: Diagnosis not present

## 2013-07-17 DIAGNOSIS — I10 Essential (primary) hypertension: Secondary | ICD-10-CM

## 2013-07-17 DIAGNOSIS — J45901 Unspecified asthma with (acute) exacerbation: Secondary | ICD-10-CM

## 2013-07-17 DIAGNOSIS — E7800 Pure hypercholesterolemia, unspecified: Secondary | ICD-10-CM | POA: Diagnosis not present

## 2013-07-17 MED ORDER — AMLODIPINE BESYLATE 5 MG PO TABS: 5.0000 mg | ORAL_TABLET | Freq: Every day | ORAL | Status: DC

## 2013-07-17 MED ORDER — PREDNISONE 20 MG PO TABS
ORAL_TABLET | ORAL | Status: DC
Start: 2013-07-17 — End: 2013-12-14

## 2013-07-17 MED ORDER — HYDROCHLOROTHIAZIDE 25 MG PO TABS: 12.5000 mg | ORAL_TABLET | Freq: Every day | ORAL | Status: DC

## 2013-07-17 MED ORDER — IPRATROPIUM-ALBUTEROL 0.5-2.5 (3) MG/3ML IN SOLN
3.0000 mL | RESPIRATORY_TRACT | Status: DC
  Administered 2013-07-17: 3 mL via RESPIRATORY_TRACT
  Filled 2013-07-17: qty 3

## 2013-07-17 MED ORDER — ALBUTEROL SULFATE HFA 108 (90 BASE) MCG/ACT IN AERS: 2.0000 | INHALATION_SPRAY | RESPIRATORY_TRACT | Status: DC | PRN

## 2013-07-17 NOTE — Discharge Instructions (Signed)
Asthma, Adult °Asthma is a recurring condition in which the airways tighten and narrow. Asthma can make it difficult to breathe. It can cause coughing, wheezing, and shortness of breath. Asthma episodes (also called asthma attacks) range from minor to life-threatening. Asthma cannot be cured, but medicines and lifestyle changes can help control it. °CAUSES °Asthma is believed to be caused by inherited (genetic) and environmental factors, but its exact cause is unknown. Asthma may be triggered by allergens, lung infections, or irritants in the air. Asthma triggers are different for each person. Common triggers include:  °· Animal dander. °· Dust mites. °· Cockroaches. °· Pollen from trees or grass. °· Mold. °· Smoke. °· Air pollutants such as dust, household cleaners, hair sprays, aerosol sprays, paint fumes, strong chemicals, or strong odors. °· Cold air, weather changes, and winds (which increase molds and pollens in the air). °· Strong emotional expressions such as crying or laughing hard. °· Stress. °· Certain medicines (such as aspirin) or types of drugs (such as beta-blockers). °· Sulfites in foods and drinks. Foods and drinks that may contain sulfites include dried fruit, potato chips, and sparkling grape juice. °· Infections or inflammatory conditions such as the flu, a cold, or an inflammation of the nasal membranes (rhinitis). °· Gastroesophageal reflux disease (GERD). °· Exercise or strenuous activity. °SYMPTOMS °Symptoms may occur immediately after asthma is triggered or many hours later. Symptoms include: °· Wheezing. °· Excessive nighttime or early morning coughing. °· Frequent or severe coughing with a common cold. °· Chest tightness. °· Shortness of breath. °DIAGNOSIS  °The diagnosis of asthma is made by a review of your medical history and a physical exam. Tests may also be performed. These may include: °· Lung function studies. These tests show how much air you breath in and out. °· Allergy  tests. °· Imaging tests such as X-rays. °TREATMENT  °Asthma cannot be cured, but it can usually be controlled. Treatment involves identifying and avoiding your asthma triggers. It also involves medicines. There are 2 classes of medicine used for asthma treatment:  °· Controller medicines. These prevent asthma symptoms from occurring. They are usually taken every day. °· Reliever or rescue medicines. These quickly relieve asthma symptoms. They are used as needed and provide short-term relief. °Your health care provider will help you create an asthma action plan. An asthma action plan is a written plan for managing and treating your asthma attacks. It includes a list of your asthma triggers and how they may be avoided. It also includes information on when medicines should be taken and when their dosage should be changed. An action plan may also involve the use of a device called a peak flow meter. A peak flow meter measures how well the lungs are working. It helps you monitor your condition. °HOME CARE INSTRUCTIONS  °· Take medicine as directed by your health care provider. Speak with your health care provider if you have questions about how or when to take the medicines. °· Use a peak flow meter as directed by your health care provider. Record and keep track of readings. °· Understand and use the action plan to help minimize or stop an asthma attack without needing to seek medical care. °· Control your home environment in the following ways to help prevent asthma attacks: °· Do not smoke. Avoid being exposed to secondhand smoke. °· Change your heating and air conditioning filter regularly. °· Limit your use of fireplaces and wood stoves. °· Get rid of pests (such as roaches and   mice) and their droppings.  Throw away plants if you see mold on them.  Clean your floors and dust regularly. Use unscented cleaning products.  Try to have someone else vacuum for you regularly. Stay out of rooms while they are being  vacuumed and for a short while afterward. If you vacuum, use a dust mask from a hardware store, a double-layered or microfilter vacuum cleaner bag, or a vacuum cleaner with a HEPA filter.  Replace carpet with wood, tile, or vinyl flooring. Carpet can trap dander and dust.  Use allergy-proof pillows, mattress covers, and box spring covers.  Wash bed sheets and blankets every week in hot water and dry them in a dryer.  Use blankets that are made of polyester or cotton.  Clean bathrooms and kitchens with bleach. If possible, have someone repaint the walls in these rooms with mold-resistant paint. Keep out of the rooms that are being cleaned and painted.  Wash hands frequently. SEEK MEDICAL CARE IF:   You have wheezing, shortness of breath, or a cough even if taking medicine to prevent attacks.  The colored mucus you cough up (sputum) is thicker than usual.  Your sputum changes from clear or white to yellow, green, gray, or bloody.  You have any problems that may be related to the medicines you are taking (such as a rash, itching, swelling, or trouble breathing).  You are using a reliever medicine more than 2 3 times per week.  Your peak flow is still at 50 79% of you personal best after following your action plan for 1 hour. SEEK IMMEDIATE MEDICAL CARE IF:   You seem to be getting worse and are unresponsive to treatment during an asthma attack.  You are short of breath even at rest.  You get short of breath when doing very little physical activity.  You have difficulty eating, drinking, or talking due to asthma symptoms.  You develop chest pain.  You develop a fast heartbeat.  You have a bluish color to your lips or fingernails.  You are lightheaded, dizzy, or faint.  Your peak flow is less than 50% of your personal best.  You have a fever or persistent symptoms for more than 2 3 days.  You have a fever and symptoms suddenly get worse. MAKE SURE YOU:   Understand these  instructions.  Will watch your condition.  Will get help right away if you are not doing well or get worse. Document Released: 05/21/2005 Document Revised: 01/21/2013 Document Reviewed: 12/18/2012 [facility] Patient Information 2014 [place], [place]. Hypertension As your heart beats, it forces blood through your arteries. This force is your blood pressure. If the pressure is too high, it is called hypertension (HTN) or high blood pressure. HTN is dangerous because you may have it and not know it. High blood pressure may mean that your heart has to work harder to pump blood. Your arteries may be narrow or stiff. The extra work puts you at risk for heart disease, stroke, and other problems.  Blood pressure consists of two numbers, a higher number over a lower, 110/72, for example. It is stated as "110 over 72." The ideal is below 120 for the top number (systolic) and under 80 for the bottom (diastolic). Write down your blood pressure today. You should pay close attention to your blood pressure if you have certain conditions such as:  Heart failure.  Prior heart attack.  Diabetes  Chronic kidney disease.  Prior stroke.  Multiple risk factors for heart disease.  To see if you have HTN, your blood pressure should be measured while you are seated with your arm held at the level of the heart. It should be measured at least twice. A one-time elevated blood pressure reading (especially in the Emergency Department) does not mean that you need treatment. There may be conditions in which the blood pressure is different between your right and left arms. It is important to see your caregiver soon for a recheck. Most people have essential hypertension which means that there is not a specific cause. This type of high blood pressure may be lowered by changing lifestyle factors such as:  Stress.  Smoking.  Lack of exercise.  Excessive weight.  Drug/tobacco/alcohol use.  Eating less salt. Most people  do not have symptoms from high blood pressure until it has caused damage to the body. Effective treatment can often prevent, delay or reduce that damage. TREATMENT  When a cause has been identified, treatment for high blood pressure is directed at the cause. There are a large number of medications to treat HTN. These fall into several categories, and your caregiver will help you select the medicines that are best for you. Medications may have side effects. You should review side effects with your caregiver. If your blood pressure stays high after you have made lifestyle changes or started on medicines,   Your medication(s) may need to be changed.  Other problems may need to be addressed.  Be certain you understand your prescriptions, and know how and when to take your medicine.  Be sure to follow up with your caregiver within the time frame advised (usually within two weeks) to have your blood pressure rechecked and to review your medications.  If you are taking more than one medicine to lower your blood pressure, make sure you know how and at what times they should be taken. Taking two medicines at the same time can result in blood pressure that is too low. SEEK IMMEDIATE MEDICAL CARE IF:  You develop a severe headache, blurred or changing vision, or confusion.  You have unusual weakness or numbness, or a faint feeling.  You have severe chest or abdominal pain, vomiting, or breathing problems. MAKE SURE YOU:   Understand these instructions.  Will watch your condition.  Will get help right away if you are not doing well or get worse. Document Released: 05/21/2005 Document Revised: 08/13/2011 Document Reviewed: 01/09/2008 [facility] Patient Information 2014 [place], [place]. Upper Respiratory Infection, Adult An upper respiratory infection (URI) is also known as the common cold. It is often caused by a type of germ (virus). Colds are easily spread (contagious). You can pass it to others by  kissing, coughing, sneezing, or drinking out of the same glass. Usually, you get better in 1 or 2 weeks.  HOME CARE   Only take medicine as told by your doctor.  Use a warm mist humidifier or breathe in steam from a hot shower.  Drink enough water and fluids to keep your pee (urine) clear or pale yellow.  Get plenty of rest.  Return to work when your temperature is back to normal or as told by your doctor. You may use a face mask and wash your hands to stop your cold from spreading. GET HELP RIGHT AWAY IF:   After the first few days, you feel you are getting worse.  You have questions about your medicine.  You have chills, shortness of breath, or brown or red spit (mucus).  You have yellow or  brown snot (nasal discharge) or pain in the face, especially when you bend forward.  You have a fever, puffy (swollen) neck, pain when you swallow, or white spots in the back of your throat.  You have a bad headache, ear pain, sinus pain, or chest pain.  You have a high-pitched whistling sound when you breathe in and out (wheezing).  You have a lasting cough or cough up blood.  You have sore muscles or a stiff neck. MAKE SURE YOU:   Understand these instructions.  Will watch your condition.  Will get help right away if you are not doing well or get worse. Document Released: 11/07/2007 Document Revised: 08/13/2011 Document Reviewed: 09/25/2010 [facility] Patient Information 2014 [place].

## 2013-07-17 NOTE — ED Notes (Signed)
3 days of sore throat cough body aches pt also states he is supposed to take azor  For hypertension but states he needs prior authorization from his insurance and he no longer has a primary doctor therefore can not get the approval. Names of several doctors in [facility] system given to patient. Pt states he is willing to take antihypertensives but is having trouble getting medicine covered.

## 2013-07-17 NOTE — ED Provider Notes (Signed)
CSN: [id number]     Arrival date & time 07/17/13  [id number] History   First MD Initiated Contact with Patient 07/17/13 1024     Chief Complaint  Patient presents with  . sore throat chills cough body aches      (Consider location/radiation/quality/duration/timing/severity/associated sxs/prior Treatment) Patient is a 48 y.o. male presenting with URI. The history is provided by the patient.  URI Presenting symptoms: congestion, cough, rhinorrhea and sore throat   Presenting symptoms: no fatigue and no fever   Severity:  Moderate Onset quality:  Gradual Duration:  3 days Timing:  Constant Progression:  Unchanged Chronicity:  New Relieved by:  Nothing Worsened by:  Nothing tried Ineffective treatments:  Inhaler, OTC medications and rest Associated symptoms: myalgias and wheezing   Associated symptoms: no arthralgias, no headaches, no neck pain, no sinus pain and no swollen glands   Wheezing:    Severity:  Moderate   Onset quality:  Gradual   Duration:  3 days   Timing:  Intermittent   Progression:  Waxing and waning   Chronicity:  Recurrent Risk factors comment:  Asthma   Past Medical History  Diagnosis Date  . Hypertension   . Unspecified asthma(493.90)   . Hyperlipidemia   . Anxiety 12/30/2011  . High cholesterol    Past Surgical History  Procedure Laterality Date  . Cholecystectomy    . Right leg surgury      pin in right leg  . Right foot surgury  apirl 2013    [facility]   Family History  Problem Relation Age of Onset  . Hypertension Father    History  Substance Use Topics  . Smoking status: Current Every Day Smoker -- 0.50 packs/day    Types: Cigarettes  . Smokeless tobacco: Never Used  . Alcohol Use: No    Review of Systems  Constitutional: Positive for chills. Negative for fever, activity change, appetite change and fatigue.  HENT: Positive for congestion, rhinorrhea and sore throat. Negative for facial swelling and trouble swallowing.   Eyes: Negative  for photophobia and pain.  Respiratory: Positive for cough and wheezing. Negative for chest tightness and shortness of breath.   Cardiovascular: Negative for chest pain and leg swelling.  Gastrointestinal: Negative for nausea, vomiting, abdominal pain, diarrhea and constipation.  Endocrine: Negative for polydipsia and polyuria.  Genitourinary: Negative for dysuria, urgency, decreased urine volume and difficulty urinating.  Musculoskeletal: Positive for myalgias. Negative for arthralgias, back pain, gait problem and neck pain.  Skin: Negative for color change, rash and wound.  Allergic/Immunologic: Negative for immunocompromised state.  Neurological: Negative for dizziness, facial asymmetry, speech difficulty, weakness, numbness and headaches.  Psychiatric/Behavioral: Negative for confusion, decreased concentration and agitation.      Allergies  Acetaminophen and Peanuts  Home Medications   Current Outpatient Rx  Name  Route  Sig  Dispense  Refill  . albuterol (PROVENTIL HFA;VENTOLIN HFA) 108 (90 BASE) MCG/ACT inhaler   Inhalation   Inhale 1-2 puffs into the lungs every 6 (six) hours as needed for wheezing or shortness of breath.   1 Inhaler   0   . albuterol (PROVENTIL HFA;VENTOLIN HFA) 108 (90 BASE) MCG/ACT inhaler   Inhalation   Inhale 2 puffs into the lungs every 4 (four) hours as needed for wheezing or shortness of breath.   1 Inhaler   1   . amLODipine (NORVASC) 5 MG tablet   Oral   Take 1 tablet (5 mg total) by mouth daily.   30 tablet  0   . amLODipine-olmesartan (AZOR) 5-40 MG per tablet   Oral   Take 1 tablet by mouth daily.   30 tablet   3   . hydrochlorothiazide (HYDRODIURIL) 25 MG tablet   Oral   Take 0.5 tablets (12.5 mg total) by mouth daily.   15 tablet   0   . multivitamin (ONE-A-DAY MEN'S) TABS   Oral   Take 1 tablet by mouth daily.         . predniSONE (DELTASONE) 20 MG tablet      3 tabs po day one, then 2 po daily x 4 days   11  tablet   0    BP 167/108  Pulse 94  Temp(Src) 98.3 F (36.8 C) (Oral)  Resp 20  Ht 5\' 9"  (1.753 m)  Wt 180 lb (81.647 kg)  BMI 26.57 kg/m2  SpO2 97% Physical Exam  Constitutional: He is oriented to person, place, and time. He appears well-developed and well-nourished. No distress.  HENT:  Head: Normocephalic and atraumatic.  Mouth/Throat: No oropharyngeal exudate.  Eyes: Pupils are equal, round, and reactive to light.  Neck: Normal range of motion. Neck supple.  Cardiovascular: Normal rate, regular rhythm and normal heart sounds.  Exam reveals no gallop and no friction rub.   No murmur heard. Pulmonary/Chest: Effort normal. No accessory muscle usage. Not tachypneic. No respiratory distress. He has no decreased breath sounds. He has wheezes in the right upper field, the right middle field, the right lower field, the left upper field, the left middle field and the left lower field. He has no rales.  Abdominal: Soft. Bowel sounds are normal. He exhibits no distension and no mass. There is no tenderness. There is no rebound and no guarding.  Musculoskeletal: Normal range of motion. He exhibits no edema and no tenderness.  Neurological: He is alert and oriented to person, place, and time.  Skin: Skin is warm and dry.  Psychiatric: He has a normal mood and affect.    ED Course  Procedures (including critical care time) Labs Review Labs Reviewed - No data to display Imaging Review Dg Chest 2 View  07/17/2013   CLINICAL DATA:  Smoker with 3 day history of cough and chest congestion.  EXAM: CHEST  2 VIEW  COMPARISON:  DG CHEST 2 VIEW dated 03/22/2012; DG CHEST 2 VIEW dated 02/08/2012; DG CHEST 2 VIEW dated 11/03/2010; DG PNEUMONIA CHEST 2V dated 01/19/2008  FINDINGS: Cardiomediastinal silhouette unremarkable and unchanged. Mild biapical pleuroparenchymal scarring, unchanged. Lungs otherwise clear. Bronchovascular markings normal. Pulmonary vascularity normal. No visible pleural effusions. No  pneumothorax. Visualized bony thorax intact. No significant interval change.  IMPRESSION: No acute cardiopulmonary disease.  Stable examination.   Electronically Signed   By: Hulan Saas M.D.   On: 07/17/2013 10:47    EKG Interpretation   None       MDM   Final diagnoses:  Viral URI  Asthma exacerbation  HTN (hypertension)    SUBJECTIVE:  Daniel Valencia is a 48 y.o. male who complains of congestion, sore throat, nasal blockage, productive cough, myalgias and chills for 3 days. He denies a history of anorexia, dizziness, fevers and vomiting and has a history of asthma. Patient admits to smoke cigarettes.   OBJECTIVE: He appears well, vital signs are as noted. Ears normal.  Throat and pharynx normal.  Neck supple. No adenopathy in the neck. Nose is congested. Sinuses non tender. There are scattered wheezes in all lung fields w/o inc WOB. CXR negative.  ASSESSMENT:  viral upper respiratory illness with asthma exacerbation.   PLAN: Symptomatic therapy suggested: push fluids, rest, use acetaminophen, ibuprofen, cough suppressant of choice prn and home albuterol. Rx will be given for 5 d opf prednisone. Lack of antibiotic effectiveness discussed with him. Return precautions given for new or worsening symptoms including worsening SOB not relieved by albuterol.   Will also refil Rx for amlodipine and Rx for HCTZ as he has been unable to fill his Azor with new Rx drug plan.  He will check BP daily and will stop HCTZ if BP under 120/80.  He will go upstairs today to try eo est w/ a new PCP.      Shanna Cisco, MD 07/17/13 [phone]

## 2013-08-04 ENCOUNTER — Encounter (HOSPITAL_COMMUNITY): Payer: Self-pay | Admitting: Emergency Medicine

## 2013-08-04 ENCOUNTER — Emergency Department (INDEPENDENT_AMBULATORY_CARE_PROVIDER_SITE_OTHER)
Admission: EM | Admit: 2013-08-04 | Discharge: 2013-08-04 | Disposition: A | Discharge status: Home / Self Care | Payer: 59 | Source: Home / Self Care | Attending: Emergency Medicine | Admitting: Emergency Medicine

## 2013-08-04 DIAGNOSIS — R059 Cough, unspecified: Secondary | ICD-10-CM

## 2013-08-04 DIAGNOSIS — R05 Cough: Secondary | ICD-10-CM

## 2013-08-04 LAB — POCT RAPID STREP A: STREPTOCOCCUS, GROUP A SCREEN (DIRECT): NEGATIVE

## 2013-08-04 MED ORDER — HYDROCOD POLST-CHLORPHEN POLST 10-8 MG/5ML PO LQCR: 5.0000 mL | Freq: Two times a day (BID) | ORAL | Status: DC | PRN

## 2013-08-04 MED ORDER — PREDNISONE 20 MG PO TABS: 20.0000 mg | ORAL_TABLET | Freq: Two times a day (BID) | ORAL | Status: DC

## 2013-08-04 MED ORDER — AMOXICILLIN-POT CLAVULANATE 875-125 MG PO TABS: 1.0000 | ORAL_TABLET | Freq: Two times a day (BID) | ORAL | Status: DC

## 2013-08-04 NOTE — ED Notes (Signed)
C/o   Sore throat.  Nonproductive cough.  States "cough keeps me up at night and is worse at night".   No relief with otc meds.    Denies fever,  N/v/d.   Symptoms present x 1 wk.

## 2013-08-04 NOTE — ED Provider Notes (Signed)
Chief Complaint   Chief Complaint  Patient presents with  . Sore Throat  . Cough    History of Present Illness   Daniel Valencia is a 48 year old male who presents with a one-week history of dry cough, aching in his ribs, sore throat, hoarseness, and myalgias. He denies any fever, chills, wheezing, shortness of breath, nasal congestion, rhinorrhea, or postnasal drip. He was seen at the emergency room on February 13 because a three-day history of cough. He was treated with cough medicine, prednisone, and an inhaler. His symptoms got better up until the past 3 days. He had a chest x-ray at that time that was normal.  Review of Systems   Other than as noted above, the patient denies any of the following symptoms: Systemic:  No fevers, chills, sweats, weight loss or gain. ENT:  No nasal congestion, sneezing, itching, postnasal drip, sinus pressure, headache, sore throat, or hoarseness. Lungs:  No wheezing, shortness of breath, chest tightness or congestion. Heart:  No chest pain, tightness, pressure, PND, orthopnea, or ankle edema. GI:  No indigestion, heartburn, waterbrash, burping, abdominal pain, nausea, or vomiting.  PMFSH   Past medical history, family history, social history, meds, and allergies were reviewed. Current meds include amlodipine and hydrochlorothiazide for high blood pressure.  Physical Examination     Vital signs:  BP 138/75  Pulse 90  Temp(Src) 98.8 F (37.1 C) (Oral)  Resp 16  SpO2 99% General:  Alert and oriented.  In no distress.  Skin warm and dry. ENT: TMs and ear canals normal.  Nasal mucosa normal, without drainage.  Pharynx was red and swollen with some white posterior pharyngeal drainage.  No intraoral lesions. Neck:  No adenopathy, tenderness or mass.  No JVD. Lungs:  No respiratory distress.  Breath sounds clear and equal bilaterally.  No wheezes, rales or rhonchi. Heart:  Regular rhythm, no gallops or murmers.  No pedal edema. Abdomon:  Soft and  nontender.  No organomegaly or mass.  Labs   Results for orders placed during the hospital encounter of 08/04/13  POCT RAPID STREP A ([facility] CARE ONLY)      Result Value Ref Range   Streptococcus, Group A Screen (Direct) NEGATIVE  NEGATIVE   Assessment   The encounter diagnosis was Cough.  Either this is an a new cough, or he has had ongoing symptoms for about the past month. He has a history of asthma but he does not have any wheezing lately. Nothing that seems to bother him most of the hoarseness.  Plan     1.  Meds:  The following meds were prescribed:   Discharge Medication List as of 08/04/2013  5:00 PM    START taking these medications   Details  amoxicillin-clavulanate (AUGMENTIN) 875-125 MG per tablet Take 1 tablet by mouth 2 (two) times daily., Starting 08/04/2013, Until Discontinued, Normal    chlorpheniramine-HYDROcodone (TUSSIONEX) 10-8 MG/5ML LQCR Take 5 mLs by mouth every 12 (twelve) hours as needed for cough., Starting 08/04/2013, Until Discontinued, Normal    !! predniSONE (DELTASONE) 20 MG tablet Take 1 tablet (20 mg total) by mouth 2 (two) times daily., Starting 08/04/2013, Until Discontinued, Normal     !! - Potential duplicate medications found. Please discuss with provider.      2.  Patient Education/Counseling:  The patient was given appropriate handouts, self care instructions, and instructed in symptomatic relief.  Advised voice rest, hot salt water gargles and lozenges.  3.  Follow up:  The patient was  told to follow up here if no better in 3 to 4 days, or sooner if becoming worse in any way, and given some red flag symptoms such as difficulty breathing or chest pain which would prompt immediate return.  Return if no better in 10 days.       Reuben Likes, MD 08/04/13 [phone]

## 2013-08-04 NOTE — Discharge Instructions (Signed)

## 2013-08-06 LAB — CULTURE, GROUP A STREP

## 2013-09-29 ENCOUNTER — Other Ambulatory Visit: Payer: Self-pay | Admitting: Internal Medicine

## 2013-10-22 ENCOUNTER — Other Ambulatory Visit: Payer: Self-pay | Admitting: Internal Medicine

## 2013-11-18 ENCOUNTER — Encounter (HOSPITAL_BASED_OUTPATIENT_CLINIC_OR_DEPARTMENT_OTHER): Payer: Self-pay | Admitting: Emergency Medicine

## 2013-11-18 ENCOUNTER — Observation Stay (HOSPITAL_COMMUNITY): Payer: 59

## 2013-11-18 ENCOUNTER — Observation Stay (HOSPITAL_BASED_OUTPATIENT_CLINIC_OR_DEPARTMENT_OTHER)
Admission: EM | Admit: 2013-11-18 | Discharge: 2013-11-19 | Disposition: A | Discharge status: Home / Self Care | Payer: 59 | Attending: Internal Medicine | Admitting: Internal Medicine

## 2013-11-18 ENCOUNTER — Emergency Department (HOSPITAL_BASED_OUTPATIENT_CLINIC_OR_DEPARTMENT_OTHER): Payer: 59

## 2013-11-18 DIAGNOSIS — R079 Chest pain, unspecified: Secondary | ICD-10-CM | POA: Insufficient documentation

## 2013-11-18 DIAGNOSIS — M25571 Pain in right ankle and joints of right foot: Secondary | ICD-10-CM

## 2013-11-18 DIAGNOSIS — L600 Ingrowing nail: Secondary | ICD-10-CM

## 2013-11-18 DIAGNOSIS — G609 Hereditary and idiopathic neuropathy, unspecified: Secondary | ICD-10-CM | POA: Diagnosis not present

## 2013-11-18 DIAGNOSIS — G8929 Other chronic pain: Secondary | ICD-10-CM

## 2013-11-18 DIAGNOSIS — Z9119 Patient's noncompliance with other medical treatment and regimen: Secondary | ICD-10-CM | POA: Insufficient documentation

## 2013-11-18 DIAGNOSIS — E7800 Pure hypercholesterolemia, unspecified: Secondary | ICD-10-CM | POA: Diagnosis not present

## 2013-11-18 DIAGNOSIS — I10 Essential (primary) hypertension: Secondary | ICD-10-CM | POA: Diagnosis not present

## 2013-11-18 DIAGNOSIS — R0602 Shortness of breath: Secondary | ICD-10-CM | POA: Insufficient documentation

## 2013-11-18 DIAGNOSIS — F411 Generalized anxiety disorder: Secondary | ICD-10-CM | POA: Diagnosis not present

## 2013-11-18 DIAGNOSIS — J45909 Unspecified asthma, uncomplicated: Secondary | ICD-10-CM | POA: Insufficient documentation

## 2013-11-18 DIAGNOSIS — F17200 Nicotine dependence, unspecified, uncomplicated: Secondary | ICD-10-CM | POA: Insufficient documentation

## 2013-11-18 DIAGNOSIS — F419 Anxiety disorder, unspecified: Secondary | ICD-10-CM

## 2013-11-18 DIAGNOSIS — G459 Transient cerebral ischemic attack, unspecified: Principal | ICD-10-CM | POA: Diagnosis present

## 2013-11-18 DIAGNOSIS — Z91199 Patient's noncompliance with other medical treatment and regimen due to unspecified reason: Secondary | ICD-10-CM | POA: Diagnosis not present

## 2013-11-18 DIAGNOSIS — R41840 Attention and concentration deficit: Secondary | ICD-10-CM

## 2013-11-18 DIAGNOSIS — M545 Low back pain: Secondary | ICD-10-CM

## 2013-11-18 DIAGNOSIS — Z0000 Encounter for general adult medical examination without abnormal findings: Secondary | ICD-10-CM

## 2013-11-18 DIAGNOSIS — E785 Hyperlipidemia, unspecified: Secondary | ICD-10-CM | POA: Insufficient documentation

## 2013-11-18 DIAGNOSIS — J309 Allergic rhinitis, unspecified: Secondary | ICD-10-CM

## 2013-11-18 DIAGNOSIS — F319 Bipolar disorder, unspecified: Secondary | ICD-10-CM

## 2013-11-18 LAB — CBC
HCT: 42.6 % (ref 39.0–52.0)
HEMOGLOBIN: 15.5 g/dL (ref 13.0–17.0)
MCH: 33.2 pg (ref 26.0–34.0)
MCHC: 36.4 g/dL — AB (ref 30.0–36.0)
MCV: 91.2 fL (ref 78.0–100.0)
Platelets: 275 10*3/uL (ref 150–400)
RBC: 4.67 MIL/uL (ref 4.22–5.81)
RDW: 12.3 % (ref 11.5–15.5)
WBC: 6.1 10*3/uL (ref 4.0–10.5)

## 2013-11-18 LAB — DIFFERENTIAL
BASOS PCT: 0 % (ref 0–1)
Basophils Absolute: 0 10*3/uL (ref 0.0–0.1)
Eosinophils Absolute: 0.6 10*3/uL (ref 0.0–0.7)
Eosinophils Relative: 10 % — ABNORMAL HIGH (ref 0–5)
Lymphocytes Relative: 47 % — ABNORMAL HIGH (ref 12–46)
Lymphs Abs: 2.9 10*3/uL (ref 0.7–4.0)
MONO ABS: 0.6 10*3/uL (ref 0.1–1.0)
MONOS PCT: 10 % (ref 3–12)
Neutro Abs: 2 10*3/uL (ref 1.7–7.7)
Neutrophils Relative %: 33 % — ABNORMAL LOW (ref 43–77)

## 2013-11-18 LAB — COMPREHENSIVE METABOLIC PANEL
ALBUMIN: 3.9 g/dL (ref 3.5–5.2)
ALT: 40 U/L (ref 0–53)
AST: 18 U/L (ref 0–37)
Alkaline Phosphatase: 90 U/L (ref 39–117)
BUN: 19 mg/dL (ref 6–23)
CO2: 24 mEq/L (ref 19–32)
CREATININE: 0.8 mg/dL (ref 0.50–1.35)
Calcium: 9.4 mg/dL (ref 8.4–10.5)
Chloride: 102 mEq/L (ref 96–112)
GFR calc Af Amer: >90 mL/min (ref >90)
GFR calc non Af Amer: >90 mL/min (ref >90)
Glucose, Bld: 123 mg/dL — ABNORMAL HIGH (ref 70–99)
POTASSIUM: 3.7 meq/L (ref 3.7–5.3)
Sodium: 140 mEq/L (ref 137–147)
Total Bilirubin: 0.4 mg/dL (ref 0.3–1.2)
Total Protein: 7.4 g/dL (ref 6.0–8.3)

## 2013-11-18 LAB — URINALYSIS, ROUTINE W REFLEX MICROSCOPIC
BILIRUBIN URINE: NEGATIVE
Glucose, UA: NEGATIVE mg/dL
HGB URINE DIPSTICK: NEGATIVE
Ketones, ur: NEGATIVE mg/dL
Leukocytes, UA: NEGATIVE
Nitrite: NEGATIVE
PROTEIN: NEGATIVE mg/dL
Specific Gravity, Urine: 1.018 (ref 1.005–1.030)
UROBILINOGEN UA: 1 mg/dL (ref 0.0–1.0)
pH: 6 (ref 5.0–8.0)

## 2013-11-18 LAB — PROTIME-INR
INR: 0.96 (ref 0.00–1.49)
Prothrombin Time: 12.6 seconds (ref 11.6–15.2)

## 2013-11-18 LAB — RAPID URINE DRUG SCREEN, HOSP PERFORMED
AMPHETAMINES: NOT DETECTED
Barbiturates: NOT DETECTED
Benzodiazepines: NOT DETECTED
COCAINE: NOT DETECTED
OPIATES: NOT DETECTED
TETRAHYDROCANNABINOL: NOT DETECTED

## 2013-11-18 LAB — PRO B NATRIURETIC PEPTIDE: PRO B NATRI PEPTIDE: 26 pg/mL (ref 0–125)

## 2013-11-18 LAB — ETHANOL

## 2013-11-18 LAB — APTT: aPTT: 29 seconds (ref 24–37)

## 2013-11-18 LAB — TROPONIN I: Troponin I: <0.3 ng/mL (ref <0.30)

## 2013-11-18 MED ORDER — ENOXAPARIN SODIUM 40 MG/0.4ML ~~LOC~~ SOLN
40.0000 mg | SUBCUTANEOUS | Status: DC
  Administered 2013-11-18: 40 mg via SUBCUTANEOUS
  Filled 2013-11-18 (×2): qty 0.4

## 2013-11-18 MED ORDER — MOMETASONE FURO-FORMOTEROL FUM 100-5 MCG/ACT IN AERO
2.0000 | INHALATION_SPRAY | Freq: Two times a day (BID) | RESPIRATORY_TRACT | Status: DC
  Administered 2013-11-18 – 2013-11-19 (×2): 2 via RESPIRATORY_TRACT
  Filled 2013-11-18 (×3): qty 8.8

## 2013-11-18 MED ORDER — AMLODIPINE BESYLATE 5 MG PO TABS
5.0000 mg | ORAL_TABLET | Freq: Every day | ORAL | Status: DC
  Administered 2013-11-18 – 2013-11-19 (×2): 5 mg via ORAL
  Filled 2013-11-18 (×2): qty 1

## 2013-11-18 MED ORDER — GADOBENATE DIMEGLUMINE 529 MG/ML IV SOLN
20.0000 mL | Freq: Once | INTRAVENOUS | Status: AC
  Administered 2013-11-18: 20 mL via INTRAVENOUS

## 2013-11-18 MED ORDER — NICOTINE 14 MG/24HR TD PT24
14.0000 mg | MEDICATED_PATCH | TRANSDERMAL | Status: DC
  Administered 2013-11-18: 14 mg via TRANSDERMAL
  Filled 2013-11-18 (×2): qty 1

## 2013-11-18 MED ORDER — OXYCODONE HCL 5 MG PO CAPS: 5.0000 mg | ORAL_CAPSULE | Freq: Four times a day (QID) | ORAL | Status: DC | PRN

## 2013-11-18 MED ORDER — ADULT MULTIVITAMIN W/MINERALS CH
1.0000 | ORAL_TABLET | Freq: Every day | ORAL | Status: DC
  Administered 2013-11-18 – 2013-11-19 (×2): 1 via ORAL
  Filled 2013-11-18 (×2): qty 1

## 2013-11-18 MED ORDER — SENNOSIDES-DOCUSATE SODIUM 8.6-50 MG PO TABS: 2.0000 | ORAL_TABLET | Freq: Every evening | ORAL | Status: DC | PRN

## 2013-11-18 MED ORDER — ALBUTEROL SULFATE (2.5 MG/3ML) 0.083% IN NEBU: 2.5000 mg | INHALATION_SOLUTION | Freq: Four times a day (QID) | RESPIRATORY_TRACT | Status: DC | PRN

## 2013-11-18 MED ORDER — LORAZEPAM 2 MG/ML IJ SOLN
INTRAMUSCULAR | Status: AC
  Administered 2013-11-18: 19:00:00
  Filled 2013-11-18: qty 1

## 2013-11-18 MED ORDER — IRBESARTAN 75 MG PO TABS
75.0000 mg | ORAL_TABLET | Freq: Every day | ORAL | Status: DC
  Administered 2013-11-18 – 2013-11-19 (×2): 75 mg via ORAL
  Filled 2013-11-18 (×2): qty 1

## 2013-11-18 MED ORDER — ASPIRIN 325 MG PO TABS
325.0000 mg | ORAL_TABLET | Freq: Every day | ORAL | Status: DC
  Administered 2013-11-18 – 2013-11-19 (×2): 325 mg via ORAL
  Filled 2013-11-18 (×2): qty 1

## 2013-11-18 MED ORDER — ASPIRIN 300 MG RE SUPP
300.0000 mg | Freq: Every day | RECTAL | Status: DC
  Filled 2013-11-18: qty 1

## 2013-11-18 MED ORDER — LORAZEPAM 2 MG/ML IJ SOLN
2.0000 mg | Freq: Once | INTRAMUSCULAR | Status: AC
  Administered 2013-11-18: 2 mg via INTRAVENOUS

## 2013-11-18 MED ORDER — OXYCODONE HCL 5 MG PO TABS
5.0000 mg | ORAL_TABLET | Freq: Four times a day (QID) | ORAL | Status: DC | PRN
  Administered 2013-11-18: 5 mg via ORAL
  Filled 2013-11-18: qty 1

## 2013-11-18 MED ORDER — ONE-A-DAY MENS PO TABS: 1.0000 | ORAL_TABLET | Freq: Every day | ORAL | Status: DC

## 2013-11-18 NOTE — Progress Notes (Signed)
EEG Completed; Results Pending  

## 2013-11-18 NOTE — H&P (Signed)
[facility] History and Physical  Beryle BeamsDirk Saur ZOX:[id number] DOB: 11/01/1965 DOA: 11/18/2013  Referring physician:  Jaci Carrelhristopher Pollina PCP:  No primary provider on file.   Chief Complaint:  Left arm tingling and numbness  HPI:  The patient is a 48 y.o. year-old male with history of HTN, HLD, asthma, anxiety who presents with intermittent left arm tingling and pins-and-needles sensation.  The patient was last at their baseline health last night before bed.  He states he slept normally but woke up with pins and needles sensation of his left hand extending up to between his elbow and his shoulder.  This sensation lasted for approximately a minute and then resolve spontaneously. This sensation has recurred numerous times over the course of the morning. Sometimes the sensation goes up to the same area of his upper, but sometimes it extends to his left chest wall. He denies slurred speech, confusion, tingling sensation of his pacer his left leg. His family states that he has been acting normally. He denies chest pressure, shortness of breath, nausea, vomiting.  In the ER, labs are normal areas vital signs stable. CT head was normal, and chest x-ray was without acute change.    Review of Systems:  General:  Denies fevers, chills, weight loss or gain HEENT:  Denies changes to hearing and vision, rhinorrhea, sinus congestion, sore throat CV:  Denies chest pain and palpitations, lower extremity edema.  PULM:  Denies SOB, wheezing, cough.   GI:  Denies nausea, vomiting, constipation, diarrhea.   GU:  Denies dysuria, frequency, urgency ENDO:  Denies polyuria, polydipsia.   HEME:  Denies hematemesis, blood in stools, melena, abnormal bruising or bleeding.  LYMPH:  Denies lymphadenopathy.   MSK:  Denies arthralgias, myalgias.   DERM:  Denies skin rash or ulcer.   NEURO:  Per history of present illness PSYCH:  Positive anxiety and denies depression.    Past Medical History  Diagnosis Date  .  Hypertension   . Unspecified asthma(493.90)   . Hyperlipidemia   . Anxiety 12/30/2011  . High cholesterol    Past Surgical History  Procedure Laterality Date  . Cholecystectomy    . Right leg surgury      pin in right leg  . Right foot surgury  apirl 2013    [facility]   Social History:  reports that he has been smoking Cigarettes.  He has been smoking about 1.00 pack per day. He has never used smokeless tobacco. He reports that he drinks alcohol. He reports that he does not use illicit drugs.  Allergies  Allergen Reactions  . Peanuts [Peanut Oil] Anaphylaxis  . Acetaminophen Other (See Comments)    Migraines.    Family History  Problem Relation Age of Onset  . Hypertension Father   . Diabetes Sister   . High blood pressure Sister   . High blood pressure Sister   . Stroke Neg Hx   . Heart attack Neg Hx      Prior to Admission medications   Medication Sig Start Date End Date Taking? Authorizing Provider  albuterol (PROVENTIL HFA;VENTOLIN HFA) 108 (90 BASE) MCG/ACT inhaler Inhale 2 puffs into the lungs every 4 (four) hours as needed for wheezing or shortness of breath. 07/17/13  Yes Shanna CiscoMegan E Docherty, MD  amLODipine (NORVASC) 5 MG tablet Take 1 tablet (5 mg total) by mouth daily. 07/17/13  Yes Shanna CiscoMegan E Docherty, MD  multivitamin (ONE-A-DAY MEN'S) TABS Take 1 tablet by mouth daily.   Yes Historical Provider, MD  oxycodone (OXY-IR) 5 MG capsule Take 5 mg by mouth every 4 (four) hours as needed for pain.    Yes Historical Provider, MD  valsartan (DIOVAN) 80 MG tablet Take 80 mg by mouth daily.   Yes Historical Provider, MD  amoxicillin-clavulanate (AUGMENTIN) 875-125 MG per tablet Take 1 tablet by mouth 2 (two) times daily. 08/04/13   Reuben Likesavid C Keller, MD  chlorpheniramine-HYDROcodone (TUSSIONEX) 10-8 MG/5ML LQCR Take 5 mLs by mouth every 12 (twelve) hours as needed for cough. 08/04/13   Reuben Likesavid C Keller, MD  predniSONE (DELTASONE) 20 MG tablet 3 tabs po day one, then 2 po daily x 4 days  07/17/13   Shanna CiscoMegan E Docherty, MD  predniSONE (DELTASONE) 20 MG tablet Take 1 tablet (20 mg total) by mouth 2 (two) times daily. 08/04/13   Reuben Likesavid C Keller, MD   Physical Exam: Filed Vitals:   11/18/13 1115 11/18/13 1130 11/18/13 1200 11/18/13 1254  BP: 128/80 132/83 138/84 130/89  Pulse: 72 71 82 76  Temp:    98.1 F (36.7 C)  TempSrc:    Oral  Resp: 15 15 20 20   Height:      Weight:      SpO2: 99% 99% 97% 99%     General:  AAM, NAD  Eyes:  PERRL but dilated and briskly reactive, anicteric, non-injected.  ENT:  Nares clear.  OP clear, non-erythematous without plaques or exudates.  MMM.  Neck:  Supple without TM or JVD.    Lymph:  No cervical, supraclavicular, or submandibular LAD.  Cardiovascular:  RRR, normal S1, S2, without m/r/g.  2+ pulses, warm extremities  Respiratory:  Wheezes right lung fields, no rales or rhonchi, no increased WOB.  Abdomen:  NABS.  Soft, ND/NT.    Skin:  No rashes or focal lesions.  Musculoskeletal:  Normal bulk and tone.  No LE edema.  Psychiatric:  A & O x 4.  Appropriate affect.  Neurologic:  CN 3-12 intact.  5/5 strength.  Sensation intact currently.  No dysmetria  Labs on Admission:  Basic Metabolic Panel:  Recent Labs Lab 11/18/13 0755  NA 140  K 3.7  CL 102  CO2 24  GLUCOSE 123*  BUN 19  CREATININE 0.80  CALCIUM 9.4   Liver Function Tests:  Recent Labs Lab 11/18/13 0755  AST 18  ALT 40  ALKPHOS 90  BILITOT 0.4  PROT 7.4  ALBUMIN 3.9   No results found for this basename: LIPASE, AMYLASE,  in the last 168 hours No results found for this basename: AMMONIA,  in the last 168 hours CBC:  Recent Labs Lab 11/18/13 0755  WBC 6.1  NEUTROABS 2.0  HGB 15.5  HCT 42.6  MCV 91.2  PLT 275   Cardiac Enzymes:  Recent Labs Lab 11/18/13 0755  TROPONINI <0.30    BNP (last 3 results)  Recent Labs  11/18/13 0755  PROBNP 26.0   CBG: No results found for this basename: GLUCAP,  in the last 168 hours  Radiological  Exams on Admission: Dg Chest 2 View  11/18/2013   CLINICAL DATA:  Left-sided numbness  EXAM: CHEST  2 VIEW  COMPARISON:  07/17/2013  FINDINGS: Normal cardiac silhouette with ectatic aorta. No effusion, infiltrate, or pneumothorax. No acute osseous abnormality. Prior cholecystectomy.  IMPRESSION: No acute cardiopulmonary process.   Electronically Signed   By: Genevive BiStewart  Edmunds M.D.   On: 11/18/2013 08:14   Ct Head Wo Contrast  11/18/2013   CLINICAL DATA:  Left-sided numbness and tingling  EXAM: CT HEAD WITHOUT CONTRAST  TECHNIQUE: Contiguous axial images were obtained from the base of the skull through the vertex without intravenous contrast.  COMPARISON:  Head CT 07/26/2008  FINDINGS: No acute intracranial hemorrhage. No focal mass lesion. No CT evidence of acute infarction. No midline shift or mass effect. No hydrocephalus. Basilar cisterns are patent.  IMPRESSION: Normal head CT   Electronically Signed   By: Genevive Bi M.D.   On: 11/18/2013 08:10    EKG: Independently reviewed. NSR  Assessment/Plan Active Problems:   TIA (transient ischemic attack)  ---  Left arm tingling and numbness, ddx includes TIA or cervical nerve root problem -  MRI/MRA head -  MRI cervical spine -  MRA neck -  Echocardiogram -  Hemoglobin A1c -  Lipid panel -  Telemetry -  Cycle troponins given the association with chest pain -  Occupational therapy -   neurology consult if MRI positive  -  Start full dose aspirin  -  Blood pressure control  -  UDS negative  Hypertension, blood pressure stable -  Continue Norvasc and ARB  Hyperlipidemia, -  Lipid panel  Asthma, non-compliant with advair due to thrush and probably has baseline wheeze -  Start dulera -  Continue prn albuterol  Anxiety, avoid sedating medications which could confuse neurologic exam  Tobacco/nicotine dependence with withdrawal -  Nicotine patch -  Counseled cessation  Diet:   healthy heart Access:   PIV IVF:   off Proph:    Lovenox   Code Status:  full code Family Communication: patient and extended family Disposition Plan: Admit to  telemetry   Time spent: 60 min Renae Fickle [facility] Pager [phone]  If 7PM-7AM, please contact night-coverage [email or web address] Password [email or web address] 11/18/2013, 1:40 PM

## 2013-11-18 NOTE — ED Notes (Signed)
[facility] at bedside for triage. 

## 2013-11-18 NOTE — ED Notes (Signed)
Dr. Camillo at bedside. 

## 2013-11-18 NOTE — ED Notes (Signed)
Internal Medicine at bedside. Pt given [place], pt able to eat per Dr. Cyril Mourningamillo.

## 2013-11-18 NOTE — Progress Notes (Signed)
  Echocardiogram 2D Echocardiogram has been performed.  Daniel Valencia, Daniel Valencia 11/18/2013, 4:46 PM

## 2013-11-18 NOTE — ED Notes (Signed)
Pt transported to 4N at this time by CondonDee, NT

## 2013-11-18 NOTE — Progress Notes (Signed)
Pt came with left arm numbness and tingling that has resolved. Alert oriented x 4 neuro intact initial assessment done. Pt  Off unitt down for MRI at 1715 and not back report given to oncoming shift RN .

## 2013-11-18 NOTE — Consult Note (Signed)
Referring Physician: Short    Chief Complaint: Left arm intermittent tingling  HPI:                                                                                                                                         Daniel Valencia is an 48 y.o. male who was feeling his baseline yesterday. He awoke today having intermittent left arm tingling throughout his whole arm.  It comes and goes and last only seconds to minutes.  He has no other associated symptoms such as weakness, diplopia, dysphagia, dysarthria, involvement of other extremities, loss of consciousness, twisting or jerking of limbs. While in consultation, he has one such episode which lasted for only seconds.   Of note: He states he has had similar episodes many years ago, but though nothing of it due to symptoms always resolving.  Date last known well: Date: 11/18/2013 Time last known well: Unable to determine tPA Given: No: out of window  Past Medical History  Diagnosis Date  . Hypertension   . Unspecified asthma(493.90)   . Hyperlipidemia   . Anxiety 12/30/2011  . High cholesterol     Past Surgical History  Procedure Laterality Date  . Cholecystectomy    . Right leg surgury      pin in right leg  . Right foot surgury  apirl 2013    [facility]    Family History  Problem Relation Age of Onset  . Hypertension Father    Social History:  reports that he has been smoking Cigarettes.  He has been smoking about 0.50 packs per day. He has never used smokeless tobacco. He reports that he does not drink alcohol or use illicit drugs.  Allergies:  Allergies  Allergen Reactions  . Peanuts [Peanut Oil] Anaphylaxis  . Acetaminophen Other (See Comments)    Migraines.    Medications:                                                                                                                           Prior to Admission:  Prescriptions prior to admission  Medication Sig Dispense Refill  . albuterol (PROVENTIL HFA;VENTOLIN  HFA) 108 (90 BASE) MCG/ACT inhaler Inhale 2 puffs into the lungs every 4 (four) hours as needed for wheezing or shortness of breath.  1 Inhaler  1  . amLODipine (NORVASC) 5 MG tablet Take  1 tablet (5 mg total) by mouth daily.  30 tablet  0  . multivitamin (ONE-A-DAY MEN'S) TABS Take 1 tablet by mouth daily.      Marland Kitchen. oxycodone (OXY-IR) 5 MG capsule Take 5 mg by mouth every 4 (four) hours as needed for pain.       . valsartan (DIOVAN) 80 MG tablet Take 80 mg by mouth daily.      Marland Kitchen. amoxicillin-clavulanate (AUGMENTIN) 875-125 MG per tablet Take 1 tablet by mouth 2 (two) times daily.  20 tablet  0  . chlorpheniramine-HYDROcodone (TUSSIONEX) 10-8 MG/5ML LQCR Take 5 mLs by mouth every 12 (twelve) hours as needed for cough.  140 mL  0  . predniSONE (DELTASONE) 20 MG tablet 3 tabs po day one, then 2 po daily x 4 days  11 tablet  0  . predniSONE (DELTASONE) 20 MG tablet Take 1 tablet (20 mg total) by mouth 2 (two) times daily.  10 tablet  0   Scheduled:  ROS:                                                                                                                                       History obtained from the patient  General ROS: negative for - chills, fatigue, fever, night sweats, weight gain or weight loss Psychological ROS: negative for - behavioral disorder, hallucinations, memory difficulties, mood swings or suicidal ideation Ophthalmic ROS: negative for - blurry vision, double vision, eye pain or loss of vision ENT ROS: negative for - epistaxis, nasal discharge, oral lesions, sore throat, tinnitus or vertigo Allergy and Immunology ROS: negative for - hives or itchy/watery eyes Hematological and Lymphatic ROS: negative for - bleeding problems, bruising or swollen lymph nodes Endocrine ROS: negative for - galactorrhea, hair pattern changes, polydipsia/polyuria or temperature intolerance Respiratory ROS: negative for - cough, hemoptysis, shortness of breath or wheezing Cardiovascular ROS:  negative for - chest pain, dyspnea on exertion, edema or irregular heartbeat Gastrointestinal ROS: negative for - abdominal pain, diarrhea, hematemesis, nausea/vomiting or stool incontinence Genito-Urinary ROS: negative for - dysuria, hematuria, incontinence or urinary frequency/urgency Musculoskeletal ROS: negative for - joint swelling or muscular weakness Neurological ROS: as noted in HPI Dermatological ROS: negative for rash and skin lesion changes  Neurologic Examination:                                                                                                      Blood pressure 138/84, pulse 82, temperature 98.3 F (36.8 C), temperature  source Oral, resp. rate 20, height 5\' 9"  (1.753 m), weight 88.451 kg (195 lb), SpO2 97.00%.   Mental Status: Alert, oriented, thought content appropriate.  Speech fluent without evidence of aphasia.  Able to follow 3 step commands without difficulty. Cranial Nerves: II: Discs flat bilaterally; Visual fields grossly normal, pupils equal, round, reactive to light and accommodation III,IV, VI: ptosis not present, extra-ocular motions intact bilaterally V,VII: smile symmetric, facial light touch sensation normal bilaterally VIII: hearing normal bilaterally IX,X: gag reflex present XI: bilateral shoulder shrug XII: midline tongue extension without atrophy or fasciculations  Motor: Right : Upper extremity   5/5    Left:     Upper extremity   5/5  Lower extremity   5/5     Lower extremity   5/5 Tone and bulk:normal tone throughout; no atrophy noted Sensory: Pinprick and light touch intact throughout, bilaterally Deep Tendon Reflexes:  Right: Upper Extremity   Left: Upper extremity   biceps (C-5 to C-6) 2/4   biceps (C-5 to C-6) 2/4 tricep (C7) 2/4    triceps (C7) 2/4 Brachioradialis (C6) 2/4  Brachioradialis (C6) 2/4  Lower Extremity Lower Extremity  quadriceps (L-2 to L-4) 2/4   quadriceps (L-2 to L-4) 2/4 Achilles (S1) 2/4   Achilles (S1)  2/4  Plantars: Right: downgoing   Left: downgoing Cerebellar: normal finger-to-nose,  normal heel-to-shin test Gait: not able to testing CV: pulses palpable throughout    Lab Results: Basic Metabolic Panel:  Recent Labs Lab 11/18/13 0755  NA 140  K 3.7  CL 102  CO2 24  GLUCOSE 123*  BUN 19  CREATININE 0.80  CALCIUM 9.4    Liver Function Tests:  Recent Labs Lab 11/18/13 0755  AST 18  ALT 40  ALKPHOS 90  BILITOT 0.4  PROT 7.4  ALBUMIN 3.9   No results found for this basename: LIPASE, AMYLASE,  in the last 168 hours No results found for this basename: AMMONIA,  in the last 168 hours  CBC:  Recent Labs Lab 11/18/13 0755  WBC 6.1  NEUTROABS 2.0  HGB 15.5  HCT 42.6  MCV 91.2  PLT 275    Cardiac Enzymes:  Recent Labs Lab 11/18/13 0755  TROPONINI <0.30    Lipid Panel: No results found for this basename: CHOL, TRIG, HDL, CHOLHDL, VLDL, LDLCALC,  in the last 168 hours  CBG: No results found for this basename: GLUCAP,  in the last 168 hours  Microbiology: Results for orders placed during the hospital encounter of 08/04/13  CULTURE, GROUP A STREP     Status: None   Collection Time    08/04/13  4:30 PM      Result Value Ref Range Status   Specimen Description THROAT   Final   Special Requests NONE   Final   Culture     Final   Value: No Beta Hemolytic Streptococci Isolated     Performed at [facility]   Report Status 08/06/2013 FINAL   Final    Coagulation Studies:  Recent Labs  11/18/13 0755  LABPROT 12.6  INR 0.96    Imaging: Dg Chest 2 View  11/18/2013   CLINICAL DATA:  Left-sided numbness  EXAM: CHEST  2 VIEW  COMPARISON:  07/17/2013  FINDINGS: Normal cardiac silhouette with ectatic aorta. No effusion, infiltrate, or pneumothorax. No acute osseous abnormality. Prior cholecystectomy.  IMPRESSION: No acute cardiopulmonary process.   Electronically Signed   By: Genevive Bi M.D.   On: 11/18/2013 08:14  Ct Head Wo  Contrast  11/18/2013   CLINICAL DATA:  Left-sided numbness and tingling  EXAM: CT HEAD WITHOUT CONTRAST  TECHNIQUE: Contiguous axial images were obtained from the base of the skull through the vertex without intravenous contrast.  COMPARISON:  Head CT 07/26/2008  FINDINGS: No acute intracranial hemorrhage. No focal mass lesion. No CT evidence of acute infarction. No midline shift or mass effect. No hydrocephalus. Basilar cisterns are patent.  IMPRESSION: Normal head CT   Electronically Signed   By: Genevive BiStewart  Edmunds M.D.   On: 11/18/2013 08:10       Assessment and plan discussed with with attending physician and they are in agreement.    Felicie MornDavid Smith PA-C [facility] [phone]  11/18/2013, 12:22 PM   Assessment: 48 y.o. male with intermittent left arm tingling over the last 24 hours.  At present time etiology is unclear.  Exam is unremarkable for weakness, dermatomal distribution of decreased sensation, no spurling sign, negative phalens and tinnels.  The fact that patient's episodes are very stereotype and short lived makes me think that perhaps this is not a TIA but perhaps a partial sensory seizures, and very unlikely cervical radiculopathy or a demyelinating disease. Continue aspirin.  Stroke Risk Factors - hyperlipidemia and hypertension  Recommend: 1) MRI brain and cervical spine with and without contrast 2) MRA head and neck.   Patient seen and examined together with physician assistant and I concur with the assessment and plan.  Wyatt Portelasvaldo Camilo, MD

## 2013-11-18 NOTE — ED Provider Notes (Signed)
CSN: [id number]     Arrival date & time 11/18/13  [id number] History   First MD Initiated Contact with Patient 11/18/13 [phone]     Chief Complaint  Patient presents with  . Numbness     (Consider location/radiation/quality/duration/timing/severity/associated sxs/prior Treatment) HPI  Daniel Valencia is a 48 y.o. male with past medical history significant for hypertension, hyperlipidemia complaining of left upper extremity and needles paresthesia onset this a.m., lasting approximately 5 minutes, it waxes and wanes. There is no associated pain, shortness of breath, dysarthria, ataxia, headache. He does endorse a left upper quadrants tingling when the tingling in the arm occurs, nausea, vomiting. [facility] evaluation TIA workup. Has had  aspirin this morning. Negative head CT.   Past Medical History  Diagnosis Date  . Hypertension   . Unspecified asthma(493.90)   . Hyperlipidemia   . Anxiety 12/30/2011  . High cholesterol    Past Surgical History  Procedure Laterality Date  . Cholecystectomy    . Right leg surgury      pin in right leg  . Right foot surgury  apirl 2013    [facility]   Family History  Problem Relation Age of Onset  . Hypertension Father    History  Substance Use Topics  . Smoking status: Current Every Day Smoker -- 0.50 packs/day    Types: Cigarettes  . Smokeless tobacco: Never Used  . Alcohol Use: No    Review of Systems 10 systems reviewed and found to be negative, except as noted in the HPI.    Allergies  Acetaminophen and Peanuts  Home Medications   Prior to Admission medications   Medication Sig Start Date End Date Taking? Authorizing Provider  oxycodone (OXY-IR) 5 MG capsule Take 5 mg by mouth every 4 (four) hours as needed.   Yes Historical Provider, MD  albuterol (PROVENTIL HFA;VENTOLIN HFA) 108 (90 BASE) MCG/ACT inhaler Inhale 1-2 puffs into the lungs every 6 (six) hours as needed for wheezing or shortness of  breath. 06/01/13   Reuben Likesavid C Keller, MD  albuterol (PROVENTIL HFA;VENTOLIN HFA) 108 (90 BASE) MCG/ACT inhaler Inhale 2 puffs into the lungs every 4 (four) hours as needed for wheezing or shortness of breath. 07/17/13   Shanna CiscoMegan E Docherty, MD  amLODipine (NORVASC) 5 MG tablet Take 1 tablet (5 mg total) by mouth daily. 07/17/13   Shanna CiscoMegan E Docherty, MD  amLODipine-olmesartan (AZOR) 5-40 MG per tablet Take 1 tablet by mouth daily. 06/01/13   Reuben Likesavid C Keller, MD  amoxicillin-clavulanate (AUGMENTIN) 875-125 MG per tablet Take 1 tablet by mouth 2 (two) times daily. 08/04/13   Reuben Likesavid C Keller, MD  chlorpheniramine-HYDROcodone (TUSSIONEX) 10-8 MG/5ML LQCR Take 5 mLs by mouth every 12 (twelve) hours as needed for cough. 08/04/13   Reuben Likesavid C Keller, MD  hydrochlorothiazide (HYDRODIURIL) 25 MG tablet Take 0.5 tablets (12.5 mg total) by mouth daily. 07/17/13   Shanna CiscoMegan E Docherty, MD  multivitamin (ONE-A-DAY MEN'S) TABS Take 1 tablet by mouth daily.    Historical Provider, MD  predniSONE (DELTASONE) 20 MG tablet 3 tabs po day one, then 2 po daily x 4 days 07/17/13   Shanna CiscoMegan E Docherty, MD  predniSONE (DELTASONE) 20 MG tablet Take 1 tablet (20 mg total) by mouth 2 (two) times daily. 08/04/13   Reuben Likesavid C Keller, MD   BP 131/90  Pulse 80  Temp(Src) 98.3 F (36.8 C) (Oral)  Resp 18  Ht 5\' 9"  (1.753 m)  Wt 195 lb (88.451 kg)  BMI 28.78 kg/m2  SpO2 100% Physical Exam  Nursing note and vitals reviewed. Constitutional: He is oriented to person, place, and time. He appears well-developed and well-nourished. No distress.  HENT:  Head: Normocephalic and atraumatic.  Eyes: Conjunctivae and EOM are normal. Pupils are equal, round, and reactive to light.  Neck: Normal range of motion.  Cardiovascular: Normal rate, regular rhythm and intact distal pulses.   Pulmonary/Chest: Effort normal and breath sounds normal. No stridor. No respiratory distress. He has no wheezes. He has no rales. He exhibits no tenderness.  Abdominal: Soft. Bowel  sounds are normal. He exhibits no distension and no mass. There is no tenderness. There is no rebound and no guarding.  Musculoskeletal: Normal range of motion.  Neurological: He is alert and oriented to person, place, and time.  II-Visual fields grossly intact. III/IV/VI-Extraocular movements intact.  Pupils reactive bilaterally. V/VII-Smile symmetric, equal eyebrow raise,  facial sensation intact VIII- Hearing grossly intact IX/X-Normal gag XI-bilateral shoulder shrug XII-midline tongue extension Motor: 5/5 bilaterally with normal tone and bulk Cerebellar: Normal finger-to-nose  and normal heel-to-shin test.   Romberg negative Ambulates with a coordinated gait   Skin: Skin is warm.  Psychiatric: He has a normal mood and affect.    ED Course  Procedures (including critical care time) Labs Review Labs Reviewed  CBC - Abnormal; Notable for the following:    MCHC 36.4 (*)    All other components within normal limits  DIFFERENTIAL - Abnormal; Notable for the following:    Neutrophils Relative % 33 (*)    Lymphocytes Relative 47 (*)    Eosinophils Relative 10 (*)    All other components within normal limits  COMPREHENSIVE METABOLIC PANEL - Abnormal; Notable for the following:    Glucose, Bld 123 (*)    All other components within normal limits  ETHANOL  PROTIME-INR  APTT  URINE RAPID DRUG SCREEN (HOSP PERFORMED)  URINALYSIS, ROUTINE W REFLEX MICROSCOPIC  TROPONIN I  PRO B NATRIURETIC PEPTIDE    Imaging Review Dg Chest 2 View  11/18/2013   CLINICAL DATA:  Left-sided numbness  EXAM: CHEST  2 VIEW  COMPARISON:  07/17/2013  FINDINGS: Normal cardiac silhouette with ectatic aorta. No effusion, infiltrate, or pneumothorax. No acute osseous abnormality. Prior cholecystectomy.  IMPRESSION: No acute cardiopulmonary process.   Electronically Signed   By: Genevive Bi M.D.   On: 11/18/2013 08:14   Ct Head Wo Contrast  11/18/2013   CLINICAL DATA:  Left-sided numbness and tingling   EXAM: CT HEAD WITHOUT CONTRAST  TECHNIQUE: Contiguous axial images were obtained from the base of the skull through the vertex without intravenous contrast.  COMPARISON:  Head CT 07/26/2008  FINDINGS: No acute intracranial hemorrhage. No focal mass lesion. No CT evidence of acute infarction. No midline shift or mass effect. No hydrocephalus. Basilar cisterns are patent.  IMPRESSION: Normal head CT   Electronically Signed   By: Genevive Bi M.D.   On: 11/18/2013 08:10     EKG Interpretation   Date/Time:  Wednesday November 18 2013 07:46:01 EDT Ventricular Rate:  97 PR Interval:  178 QRS Duration: 88 QT Interval:  338 QTC Calculation: 429 R Axis:   21 Text Interpretation:  Normal sinus rhythm Normal ECG Confirmed by POLLINA   MD, CHRISTOPHER ([id number]) on 11/18/2013 7:54:07 AM      MDM   Final diagnoses:  TIA (transient ischemic attack)    Filed Vitals:   11/18/13 0747 11/18/13 0943 11/18/13 1112  BP: 159/98 127/83 131/90  Pulse: 97 80 80  Temp: 98.2 F (36.8 C)  98.3 F (36.8 C)  TempSrc: Oral  Oral  Resp: 18 16 18   Height:   5\' 9"  (1.753 m)  Weight:   195 lb (88.451 kg)  SpO2: 99% 99% 100%    Daniel Valencia is a 48 y.o. male presenting with left upper extremity pins and needles paresthesia, waxing and waning onset this a.m. Patient has stroke or vascular of hypertension, hyperlipidemia. Head CT is negative, patient has received aspirin, transferred to [facility] for neurologic evaluation. Patient will be admitted to [facility] hospitalist Dr. Malachi BondsShort. Neurologist Dr. Cyril Mourningamillo consulted.    Wynetta Emeryicole Katonya Blecher, PA-C 11/18/13 1201

## 2013-11-18 NOTE — ED Notes (Signed)
Patient woke up this morning with left arm tingling and numbness. States that he felt some tightness in his chest for a few moments on the way to the ER. Movement intact

## 2013-11-18 NOTE — ED Provider Notes (Signed)
CSN: [id number]     Arrival date & time 11/18/13  [id number] History   First MD Initiated Contact with Patient 11/18/13 [phone]     Chief Complaint  Patient presents with  . Numbness     (Consider location/radiation/quality/duration/timing/severity/associated sxs/prior Treatment) HPI Comments: Patient presents to the ER for evaluation of chest discomfort and left arm tingling and numbness. The patient reports that he woke up this morning and approximately 10 minutes after waking felt pins and needles in his left arm. Symptoms lasted for several minutes and then resolved. He reports that it has occurred several times. He did have an episode of brief tightness in his chest which also spontaneously resolved. When that was present, he felt mildly short of breath. At time of evaluation here in the ER, her symptoms are resolved. Patient did take an aspirin at home.   Past Medical History  Diagnosis Date  . Hypertension   . Unspecified asthma(493.90)   . Hyperlipidemia   . Anxiety 12/30/2011  . High cholesterol    Past Surgical History  Procedure Laterality Date  . Cholecystectomy    . Right leg surgury      pin in right leg  . Right foot surgury  apirl 2013    [facility]   Family History  Problem Relation Age of Onset  . Hypertension Father    History  Substance Use Topics  . Smoking status: Current Every Day Smoker -- 0.50 packs/day    Types: Cigarettes  . Smokeless tobacco: Never Used  . Alcohol Use: No    Review of Systems  Respiratory: Positive for shortness of breath.   Cardiovascular: Positive for chest pain.  Neurological: Positive for numbness. Negative for dizziness and headaches.  All other systems reviewed and are negative.     Allergies  Acetaminophen and Peanuts  Home Medications   Prior to Admission medications   Medication Sig Start Date End Date Taking? Authorizing Provider  oxycodone (OXY-IR) 5 MG capsule Take 5 mg by mouth every 4 (four) hours as needed.    Yes Historical Provider, MD  albuterol (PROVENTIL HFA;VENTOLIN HFA) 108 (90 BASE) MCG/ACT inhaler Inhale 1-2 puffs into the lungs every 6 (six) hours as needed for wheezing or shortness of breath. 06/01/13   Reuben Likesavid C Keller, MD  albuterol (PROVENTIL HFA;VENTOLIN HFA) 108 (90 BASE) MCG/ACT inhaler Inhale 2 puffs into the lungs every 4 (four) hours as needed for wheezing or shortness of breath. 07/17/13   Shanna CiscoMegan E Docherty, MD  amLODipine (NORVASC) 5 MG tablet Take 1 tablet (5 mg total) by mouth daily. 07/17/13   Shanna CiscoMegan E Docherty, MD  amLODipine-olmesartan (AZOR) 5-40 MG per tablet Take 1 tablet by mouth daily. 06/01/13   Reuben Likesavid C Keller, MD  amoxicillin-clavulanate (AUGMENTIN) 875-125 MG per tablet Take 1 tablet by mouth 2 (two) times daily. 08/04/13   Reuben Likesavid C Keller, MD  chlorpheniramine-HYDROcodone (TUSSIONEX) 10-8 MG/5ML LQCR Take 5 mLs by mouth every 12 (twelve) hours as needed for cough. 08/04/13   Reuben Likesavid C Keller, MD  hydrochlorothiazide (HYDRODIURIL) 25 MG tablet Take 0.5 tablets (12.5 mg total) by mouth daily. 07/17/13   Shanna CiscoMegan E Docherty, MD  multivitamin (ONE-A-DAY MEN'S) TABS Take 1 tablet by mouth daily.    Historical Provider, MD  predniSONE (DELTASONE) 20 MG tablet 3 tabs po day one, then 2 po daily x 4 days 07/17/13   Shanna CiscoMegan E Docherty, MD  predniSONE (DELTASONE) 20 MG tablet Take 1 tablet (20 mg total) by mouth 2 (two) times daily.  08/04/13   Reuben Likesavid C Keller, MD   BP 159/98  Pulse 97  Temp(Src) 98.2 F (36.8 C) (Oral)  Resp 18  SpO2 99% Physical Exam  Constitutional: He is oriented to person, place, and time. He appears well-developed and well-nourished. No distress.  HENT:  Head: Normocephalic and atraumatic.  Right Ear: Hearing normal.  Left Ear: Hearing normal.  Nose: Nose normal.  Mouth/Throat: Oropharynx is clear and moist and mucous membranes are normal.  Eyes: Conjunctivae and EOM are normal. Pupils are equal, round, and reactive to light.  Neck: Normal range of motion. Neck supple.   Cardiovascular: Regular rhythm, S1 normal and S2 normal.  Exam reveals no gallop and no friction rub.   No murmur heard. Pulmonary/Chest: Effort normal and breath sounds normal. No respiratory distress. He exhibits no tenderness.  Abdominal: Soft. Normal appearance and bowel sounds are normal. There is no hepatosplenomegaly. There is no tenderness. There is no rebound, no guarding, no tenderness at McBurney's point and negative Murphy's sign. No hernia.  Musculoskeletal: Normal range of motion.  Neurological: He is alert and oriented to person, place, and time. He has normal strength. No cranial nerve deficit or sensory deficit. Coordination normal. GCS eye subscore is 4. GCS verbal subscore is 5. GCS motor subscore is 6.  Extraocular muscle movement: normal No visual field cut Pupils: equal and reactive both direct and consensual response is normal No nystagmus present  Sensory function is intact to light touch, pinprick Proprioception intact  Grip strength 5/5 symmetric in upper extremities Lower extremity strength 5/5 against gravity No pronator drift Normal finger to nose bilaterally Normal heel to shin bilaterally  Gait: normal Rhomberg: normal   Skin: Skin is warm, dry and intact. No rash noted. No cyanosis.  Psychiatric: He has a normal mood and affect. His speech is normal and behavior is normal. Thought content normal.    ED Course  Procedures (including critical care time) Labs Review Labs Reviewed  CBC - Abnormal; Notable for the following:    MCHC 36.4 (*)    All other components within normal limits  DIFFERENTIAL - Abnormal; Notable for the following:    Neutrophils Relative % 33 (*)    Lymphocytes Relative 47 (*)    Eosinophils Relative 10 (*)    All other components within normal limits  COMPREHENSIVE METABOLIC PANEL - Abnormal; Notable for the following:    Glucose, Bld 123 (*)    All other components within normal limits  ETHANOL  PROTIME-INR  APTT   URINE RAPID DRUG SCREEN (HOSP PERFORMED)  URINALYSIS, ROUTINE W REFLEX MICROSCOPIC  TROPONIN I  PRO B NATRIURETIC PEPTIDE    Imaging Review Dg Chest 2 View  11/18/2013   CLINICAL DATA:  Left-sided numbness  EXAM: CHEST  2 VIEW  COMPARISON:  07/17/2013  FINDINGS: Normal cardiac silhouette with ectatic aorta. No effusion, infiltrate, or pneumothorax. No acute osseous abnormality. Prior cholecystectomy.  IMPRESSION: No acute cardiopulmonary process.   Electronically Signed   By: Genevive BiStewart  Edmunds M.D.   On: 11/18/2013 08:14   Ct Head Wo Contrast  11/18/2013   CLINICAL DATA:  Left-sided numbness and tingling  EXAM: CT HEAD WITHOUT CONTRAST  TECHNIQUE: Contiguous axial images were obtained from the base of the skull through the vertex without intravenous contrast.  COMPARISON:  Head CT 07/26/2008  FINDINGS: No acute intracranial hemorrhage. No focal mass lesion. No CT evidence of acute infarction. No midline shift or mass effect. No hydrocephalus. Basilar cisterns are patent.  IMPRESSION: Normal  head CT   Electronically Signed   By: Genevive Bi M.D.   On: 11/18/2013 08:10     EKG Interpretation   Date/Time:  Wednesday November 18 2013 07:46:01 EDT Ventricular Rate:  97 PR Interval:  178 QRS Duration: 88 QT Interval:  338 QTC Calculation: 429 R Axis:   21 Text Interpretation:  Normal sinus rhythm Normal ECG Confirmed by POLLINA   MD, CHRISTOPHER ([id number]) on 11/18/2013 7:54:07 AM      MDM   Final diagnoses:  None   TIA  Patient presents to the ER for evaluation of left arm numbness and tingling. He describes it as "pins and needles. Symptoms have been coming and going over the course of the morning. Upon initial evaluation, symptoms had completely resolved. He had no neurologic deficit. He has had episodes of pins and needles in the hand during the period of evaluation, however. Symptoms last for a couple of minutes and then resolve. Examination has not identified any functional deficit,  however.  Based on the patient's presentation, differential diagnosis was peripheral neuropathy versus stuttering TIA. Head CT was performed and is negative.  Patient also had some minor chest discomfort earlier which has resolved. His cardiac evaluation is negative.  Patient does have cardiovascular risk factors including hypertension, hyperlipidemia/high cholesterol, and smoking status. He denies any family history of heart disease.  Based on the patient's presentation, I have recommended admission to the hospital for further evaluation of TIA versus CVA. Case was discussed with Doctor Leroy Kennedy, neurology. He agreed that the patient would need further workup. He recommended the patient be sent to [facility] emergency department. At this point, with the patient's current subjective findings only and his rapid cycling of symptoms, the patient is not considered a code stroke.  Case discussed briefly with Doctor Juleen China, ER physician. Patient will be seen in the ER by emergency physician as well as neurology.    Gilda Crease, MD 11/18/13 [phone]

## 2013-11-19 LAB — HEMOGLOBIN A1C
Hgb A1c MFr Bld: 5.8 % — ABNORMAL HIGH (ref <5.7)
MEAN PLASMA GLUCOSE: 120 mg/dL — AB (ref <117)

## 2013-11-19 LAB — LIPID PANEL
Cholesterol: 229 mg/dL — ABNORMAL HIGH (ref 0–200)
HDL: 50 mg/dL (ref >39)
LDL Cholesterol: 149 mg/dL — ABNORMAL HIGH (ref 0–99)
Total CHOL/HDL Ratio: 4.6 RATIO
Triglycerides: 148 mg/dL (ref <150)
VLDL: 30 mg/dL (ref 0–40)

## 2013-11-19 MED ORDER — ASPIRIN EC 81 MG PO TBEC: 81.0000 mg | DELAYED_RELEASE_TABLET | Freq: Every day | ORAL | Status: DC

## 2013-11-19 MED ORDER — LORAZEPAM 0.5 MG PO TABS
0.5000 mg | ORAL_TABLET | Freq: Once | ORAL | Status: AC
  Administered 2013-11-19: 0.5 mg via ORAL
  Filled 2013-11-19: qty 1

## 2013-11-19 MED ORDER — NICOTINE 14 MG/24HR TD PT24: 14.0000 mg | MEDICATED_PATCH | TRANSDERMAL | Status: DC

## 2013-11-19 NOTE — Progress Notes (Signed)
Pt A&O x4; pt discharge education and instructions completed with pt and family at side. Both voices understanding and denies any questions. Pt IV and telemetry removed; pt informed his nicotine patch and aspirin prescription sent to his preferred pharmacy and pt voices understanding. Pt handed stroke print out education along with smoking cessation and cholesterol. Pt discharge home; pt ambulated off unit with his belongings and family member to transport pt off to disposition. Dionne BucyP. Amo Kuffour RN

## 2013-11-19 NOTE — Progress Notes (Signed)
I asked patient again if could have his albuterol inhaler and he gave it to me I put a sticker on it and put in a BIO bag in his chart per my Charge Nurses direction.

## 2013-11-19 NOTE — Progress Notes (Signed)
Occupational Therapy Evaluation and Discharge Patient Details Name: Daniel Valencia MRN: [id number] DOB: 1965-06-30 Today's Date: 11/19/2013    History of Present Illness Daniel Valencia is a 48 y.o. Male admitted 11/18/13 due to c/o LUE tingling and numbness. CT and MRI were negative for acute abnormalities.    Clinical Impression   PTA pt lived at home and was independent with ADLs and functional mobility. Pt overall at independent level currently for ADLs and functional mobility and reports that tingling/numbness in LUE has resolved. Brief assessment performed for safety at home. Educated pt with teach back of signs and symptoms of stroke. Acute OT to sign off.     Follow Up Recommendations  No OT follow up    Equipment Recommendations  None recommended by OT       Precautions / Restrictions Restrictions Weight Bearing Restrictions: No      Mobility Bed Mobility Overal bed mobility: Independent                Transfers Overall transfer level: Independent Equipment used: None                  Balance Overall balance assessment: No apparent balance deficits (not formally assessed)                                          ADL Overall ADL's : Independent;At baseline                                       General ADL Comments: Pt reports that numbness and tingling has resolved in LUE. Reviewed signs and symptoms of stroke with pt teach back. Educated pt on fall prevention and safety at home with ADLs. Encouraged pt to communicate with PCP and call for help if symptoms of stroke occur.      Vision  Pt reports no change from baseline. No apparent visual deficits.                   Perception Perception Perception Tested?: No   Praxis Praxis Praxis tested?: Within functional limits    Pertinent Vitals/Pain NAD, HR increased to 100bpm when standing, however returned to apprx 93bpm sitting EOB.      Hand Dominance Right    Extremity/Trunk Assessment Upper Extremity Assessment Upper Extremity Assessment: Overall WFL for tasks assessed   Lower Extremity Assessment Lower Extremity Assessment: Overall WFL for tasks assessed   Cervical / Trunk Assessment Cervical / Trunk Assessment: Normal   Communication Communication Communication: No difficulties   Cognition Arousal/Alertness: Awake/alert Behavior During Therapy: WFL for tasks assessed/performed Overall Cognitive Status: Within Functional Limits for tasks assessed                                Home Living Family/patient expects to be discharged to:: Private residence Living Arrangements: Children Available Help at Discharge: Family;Available 24 hours/day (teenage son will be around to help)                                    Prior Functioning/Environment Level of Independence: Independent  End of Session Nurse Communication: Other (comment);Mobility status (No therapy needs at this time. )  Activity Tolerance: Patient tolerated treatment well Patient left: in bed;with call bell/phone within reach;with family/visitor present   Time: [id number] OT Time Calculation (min): 10 min Charges:  OT General Charges $OT Visit: 1 Procedure OT Evaluation $Initial OT Evaluation Tier I: 1 Procedure G-Codes: OT G-codes **NOT FOR INPATIENT CLASS** Functional Assessment Tool Used: Clinical judgment Functional Limitation: Self care Self Care Current Status (N6295(G8987): 0 percent impaired, limited or restricted Self Care Goal Status (M8413(G8988): 0 percent impaired, limited or restricted Self Care Discharge Status (K4401(G8989): 0 percent impaired, limited or restricted   Rae LipsMiller, LeeAnn M [phone] 11/19/2013, 10:42 AM

## 2013-11-19 NOTE — Procedures (Signed)
EEG report.  Brief clinical history: 48 y.o. male with intermittent left arm tingling over the last 24 hours. No prior history of frank epileptic seizures.  Technique: this is a 18 channel routine scalp EEG performed at the bedside with bipolar and monopolar montages arranged in accordance to the international 10/20 system of electrode placement. One channel was dedicated to EKG recording.  The study was performed during wakefulness, drowsiness, and stage 2 sleep. No activating procedures performed.  Description:In the wakeful state, the best background consisted of a medium amplitude, posterior dominant, well sustained, symmetric and reactive 11 Hz rhythm. Drowsiness demonstrated dropout of the alpha rhythm. Stage 2 sleep showed symmetric and synchronous sleep spindles without intermixed epileptiform discharges.  No focal or generalized epileptiform discharges noted.  No pathologic areas of slowing seen.  EKG showed sinus rhythm.  Impression: this is a normal awake and asleep EEG. Please, be aware that a normal EEG does not exclude the possibility of epilepsy.  Clinical correlation is advised.  Wyatt Portelasvaldo Camilo, MD

## 2013-11-19 NOTE — Progress Notes (Signed)
UR Completed Catalaya Garr Graves-Bigelow, RN,BSN [phone]  

## 2013-11-19 NOTE — Progress Notes (Signed)
NEURO HOSPITALIST PROGRESS NOTE   SUBJECTIVE:                                                                                                                        Mr. Inclan stated that he is doing well this morning, and the numbness-tingling of the left upper extremity is substantially improved. MRI brain and MRI cervical spine are unimpressive. EEG normal. He said that ativan did help him to relax.  OBJECTIVE:                                                                                                                           Vital signs in last 24 hours: Temp:  [97.7 F (36.5 C)-98.4 F (36.9 C)] 98.2 F (36.8 C) (06/18 0800) Pulse Rate:  [71-84] 77 (06/18 0800) Resp:  [15-20] 18 (06/18 0800) BP: (127-152)/(79-93) 135/93 mmHg (06/18 0800) SpO2:  [96 %-100 %] 99 % (06/18 0800) Weight:  [88.451 kg (195 lb)] 88.451 kg (195 lb) (06/17 1112)  Intake/Output from previous day: 06/17 0701 - 06/18 0700 In: 240 [P.O.:240] Out: 200 [Urine:200] Intake/Output this shift:   Nutritional status: General  Past Medical History  Diagnosis Date  . Hypertension   . Unspecified asthma(493.90)   . Hyperlipidemia   . Anxiety 12/30/2011  . High cholesterol     Neurologic Exam:  Mental Status:  Alert, oriented, thought content appropriate. Speech fluent without evidence of aphasia. Able to follow 3 step commands without difficulty.  Cranial Nerves:  II: Discs flat bilaterally; Visual fields grossly normal, pupils equal, round, reactive to light and accommodation  III,IV, VI: ptosis not present, extra-ocular motions intact bilaterally  V,VII: smile symmetric, facial light touch sensation normal bilaterally  VIII: hearing normal bilaterally  IX,X: gag reflex present  XI: bilateral shoulder shrug  XII: midline tongue extension without atrophy or fasciculations  Motor:  Right : Upper extremity 5/5 Left: Upper extremity 5/5  Lower extremity 5/5 Lower  extremity 5/5  Tone and bulk:normal tone throughout; no atrophy noted  Sensory: Pinprick and light touch intact throughout, bilaterally  Deep Tendon Reflexes:  Right: Upper Extremity Left: Upper extremity  biceps (C-5 to C-6) 2/4 biceps (C-5 to C-6) 2/4  tricep (C7) 2/4 triceps (C7) 2/4  Brachioradialis (C6) 2/4 Brachioradialis (C6) 2/4  Lower Extremity Lower Extremity  quadriceps (L-2 to L-4) 2/4 quadriceps (L-2 to L-4) 2/4  Achilles (S1) 2/4 Achilles (S1) 2/4  Plantars:  Right: downgoing Left: downgoing  Cerebellar:  normal finger-to-nose, normal heel-to-shin test  Gait: not tested   Lab Results: Lab Results  Component Value Date/Time   CHOL 229* 11/19/2013  6:00 AM   Lipid Panel  Recent Labs  11/19/13 0600  CHOL 229*  TRIG 148  HDL 50  CHOLHDL 4.6  VLDL 30  LDLCALC 409149*    Studies/Results: Dg Chest 2 View  11/18/2013   CLINICAL DATA:  Left-sided numbness  EXAM: CHEST  2 VIEW  COMPARISON:  07/17/2013  FINDINGS: Normal cardiac silhouette with ectatic aorta. No effusion, infiltrate, or pneumothorax. No acute osseous abnormality. Prior cholecystectomy.  IMPRESSION: No acute cardiopulmonary process.   Electronically Signed   By: Genevive BiStewart  Edmunds M.D.   On: 11/18/2013 08:14   Ct Head Wo Contrast  11/18/2013   CLINICAL DATA:  Left-sided numbness and tingling  EXAM: CT HEAD WITHOUT CONTRAST  TECHNIQUE: Contiguous axial images were obtained from the base of the skull through the vertex without intravenous contrast.  COMPARISON:  Head CT 07/26/2008  FINDINGS: No acute intracranial hemorrhage. No focal mass lesion. No CT evidence of acute infarction. No midline shift or mass effect. No hydrocephalus. Basilar cisterns are patent.  IMPRESSION: Normal head CT   Electronically Signed   By: Genevive BiStewart  Edmunds M.D.   On: 11/18/2013 08:10   Mr Maxine GlennMra Head Wo Contrast  11/18/2013   CLINICAL DATA:  Left arm tingling.  Rule out stroke  EXAM: MRI HEAD WITHOUT AND WITH CONTRAST  MRA HEAD WITHOUT  CONTRAST  MRA NECK WITHOUT AND WITH CONTRAST  TECHNIQUE: Multiplanar, multiecho pulse sequences of the brain and surrounding structures were obtained without and with intravenous contrast. Angiographic images of the Circle of Willis were obtained using MRA technique without intravenous contrast. Angiographic images of the neck were obtained using MRA technique without and with intravenous contrast. Carotid stenosis measurements (when applicable) are obtained utilizing NASCET criteria, using the distal internal carotid diameter as the denominator.  CONTRAST:  20mL MULTIHANCE GADOBENATE DIMEGLUMINE 529 MG/ML IV SOLN  COMPARISON:  CT head 11/18/2013  FINDINGS: MRI HEAD FINDINGS  Negative for acute infarct. Negative for chronic infarct. Cerebral white matter is normal. Negative for demyelinating disease.  Ventricle size is normal. Negative for hemorrhage or mass. Postcontrast imaging degraded by significant motion, however no enhancing lesion is identified.  MRA HEAD FINDINGS  Mild motion.  Both vertebral arteries are patent to the basilar. PICA patent bilaterally. Basilar widely patent. Superior cerebellar and posterior cerebral arteries are widely patent bilaterally.  Internal carotid artery patent bilaterally without stenosis. Anterior and middle cerebral arteries patent without stenosis  Negative for cerebral aneurysm.  MRA NECK FINDINGS  Standard branching of the aortic arch. Proximal great vessels are widely patent. Carotid bifurcation widely patent. Both vertebral arteries are equal in size and widely patent to the basilar. No carotid or vertebral stenosis or dissection.  IMPRESSION: Negative MRI of the head with contrast  Negative MRA of the head  Negative MRA of the neck   Electronically Signed   By: Marlan Palauharles  Clark M.D.   On: 11/18/2013 21:08   Mr Angiogram Neck W Wo Contrast  11/18/2013   CLINICAL DATA:  Left arm tingling.  Rule out stroke  EXAM: MRI HEAD WITHOUT AND WITH CONTRAST  MRA HEAD WITHOUT CONTRAST   MRA  NECK WITHOUT AND WITH CONTRAST  TECHNIQUE: Multiplanar, multiecho pulse sequences of the brain and surrounding structures were obtained without and with intravenous contrast. Angiographic images of the Circle of Willis were obtained using MRA technique without intravenous contrast. Angiographic images of the neck were obtained using MRA technique without and with intravenous contrast. Carotid stenosis measurements (when applicable) are obtained utilizing NASCET criteria, using the distal internal carotid diameter as the denominator.  CONTRAST:  20mL MULTIHANCE GADOBENATE DIMEGLUMINE 529 MG/ML IV SOLN  COMPARISON:  CT head 11/18/2013  FINDINGS: MRI HEAD FINDINGS  Negative for acute infarct. Negative for chronic infarct. Cerebral white matter is normal. Negative for demyelinating disease.  Ventricle size is normal. Negative for hemorrhage or mass. Postcontrast imaging degraded by significant motion, however no enhancing lesion is identified.  MRA HEAD FINDINGS  Mild motion.  Both vertebral arteries are patent to the basilar. PICA patent bilaterally. Basilar widely patent. Superior cerebellar and posterior cerebral arteries are widely patent bilaterally.  Internal carotid artery patent bilaterally without stenosis. Anterior and middle cerebral arteries patent without stenosis  Negative for cerebral aneurysm.  MRA NECK FINDINGS  Standard branching of the aortic arch. Proximal great vessels are widely patent. Carotid bifurcation widely patent. Both vertebral arteries are equal in size and widely patent to the basilar. No carotid or vertebral stenosis or dissection.  IMPRESSION: Negative MRI of the head with contrast  Negative MRA of the head  Negative MRA of the neck   Electronically Signed   By: Marlan Palauharles  Clark M.D.   On: 11/18/2013 21:08   Mr Laqueta JeanBrain W WUWo Contrast  11/18/2013   CLINICAL DATA:  Left arm tingling.  Rule out stroke  EXAM: MRI HEAD WITHOUT AND WITH CONTRAST  MRA HEAD WITHOUT CONTRAST  MRA NECK WITHOUT  AND WITH CONTRAST  TECHNIQUE: Multiplanar, multiecho pulse sequences of the brain and surrounding structures were obtained without and with intravenous contrast. Angiographic images of the Circle of Willis were obtained using MRA technique without intravenous contrast. Angiographic images of the neck were obtained using MRA technique without and with intravenous contrast. Carotid stenosis measurements (when applicable) are obtained utilizing NASCET criteria, using the distal internal carotid diameter as the denominator.  CONTRAST:  20mL MULTIHANCE GADOBENATE DIMEGLUMINE 529 MG/ML IV SOLN  COMPARISON:  CT head 11/18/2013  FINDINGS: MRI HEAD FINDINGS  Negative for acute infarct. Negative for chronic infarct. Cerebral white matter is normal. Negative for demyelinating disease.  Ventricle size is normal. Negative for hemorrhage or mass. Postcontrast imaging degraded by significant motion, however no enhancing lesion is identified.  MRA HEAD FINDINGS  Mild motion.  Both vertebral arteries are patent to the basilar. PICA patent bilaterally. Basilar widely patent. Superior cerebellar and posterior cerebral arteries are widely patent bilaterally.  Internal carotid artery patent bilaterally without stenosis. Anterior and middle cerebral arteries patent without stenosis  Negative for cerebral aneurysm.  MRA NECK FINDINGS  Standard branching of the aortic arch. Proximal great vessels are widely patent. Carotid bifurcation widely patent. Both vertebral arteries are equal in size and widely patent to the basilar. No carotid or vertebral stenosis or dissection.  IMPRESSION: Negative MRI of the head with contrast  Negative MRA of the head  Negative MRA of the neck   Electronically Signed   By: Marlan Palauharles  Clark M.D.   On: 11/18/2013 21:08   Mr Cervical Spine W Wo Contrast  11/18/2013   CLINICAL DATA:  Left arm tingling  EXAM: MRI CERVICAL SPINE WITHOUT AND WITH CONTRAST  TECHNIQUE: Multiplanar and  multiecho pulse sequences of the  cervical spine, to include the craniocervical junction and cervicothoracic junction, were obtained according to standard protocol without and with intravenous contrast.  CONTRAST:  20mL MULTIHANCE GADOBENATE DIMEGLUMINE 529 MG/ML IV SOLN  COMPARISON:  None.  FINDINGS: Image quality degraded by motion. The postcontrast images are particularly degraded by motion.  Normal cervical alignment. Negative for fracture or mass. No cord compression. No cord lesion identified. Craniocervical junction normal.  C2-3:  Negative  C3-4: Mild disc degeneration and spondylosis with mild foraminal stenosis bilaterally  C4-5:  Mild disc degeneration without stenosis  C5-6:  Mild spondylosis.  Left foraminal narrowing due to spurring  C6-7:  Negative  C7-T1:  Negative  IMPRESSION: Image quality degraded by motion.  Mild cervical degenerative changes. Mild left foraminal encroachment due to uncinate spurring.   Electronically Signed   By: Marlan Palau M.D.   On: 11/18/2013 21:12    MEDICATIONS                                                                                                                        Scheduled: . amLODipine  5 mg Oral Daily  . aspirin  300 mg Rectal Daily   Or  . aspirin  325 mg Oral Daily  . enoxaparin (LOVENOX) injection  40 mg Subcutaneous Q24H  . irbesartan  75 mg Oral Daily  . mometasone-formoterol  2 puff Inhalation BID  . multivitamin with minerals  1 tablet Oral Daily  . nicotine  14 mg Transdermal Q24H    ASSESSMENT/PLAN:                                                                                                            48 y/o with new onset intermittent paresthesias left upper extremity that don't follow a specific anatomical pattern. Much improved today and said that ativan helped him to relax. MRI brain/Cervical spine unremarkable. EEG normal. Test results discussed with patient. I think no further testing as inpatient is required and he can be discharge home with  outpatient neurology follow up in order to complete NCS/EMG to exclude the possibility of an entrapment neuropathy. Unclear if anxiety/stress could be playing a role in his symptoms. Will sign off.  Wyatt Portela, MD [facility] [phone]  11/19/2013, 9:04 AM

## 2013-11-19 NOTE — Discharge Summary (Addendum)
Daniel Valencia, is a 48 y.o. male  DOB 1966/05/24  MRN [id number].  Admission date:  11/18/2013  Admitting Physician  Renae Fickle, MD  Discharge Date:  11/19/2013   Primary MD  Cain Saupe, MD  Recommendations for primary care physician for things to follow:   Monitor pending A1c, outpatient monitoring of lipid panel and blood pressure. Secondary factors for TIA CVA.   Admission Diagnosis  TIA (transient ischemic attack) [435.9]   Discharge Diagnosis  TIA (transient ischemic attack) [435.9]  versus peripheral neuropathy  Active Problems:   TIA (transient ischemic attack)      Past Medical History  Diagnosis Date  . Hypertension   . Unspecified asthma(493.90)   . Hyperlipidemia   . Anxiety 12/30/2011  . High cholesterol     Past Surgical History  Procedure Laterality Date  . Cholecystectomy    . Right leg surgury      pin in right leg  . Right foot surgury  apirl 2013    [facility]     Discharge Condition: Stable   Follow UP  Follow-up Information   Follow up with FULP, CAMMIE, MD. Schedule an appointment as soon as possible for a visit in 1 week.   Specialty:  Family Medicine   Contact information:   [address] [place] [address] [place] [address] [phone]       Follow up with [facility]. Schedule an appointment as soon as possible for a visit in 1 week.   Contact information:   [address]     [address] [place] [address] [phone]        Discharge Instructions  and  Discharge Medications         Discharge Instructions   Diet - low sodium heart healthy    Complete by:  As directed      Discharge instructions    Complete by:  As directed   Follow with Primary MD  in 7 days   Get CBC, CMP  checked  by Primary MD next visit.    Activity: As  tolerated with Full fall precautions use walker/cane & assistance as needed   Disposition Home     Diet: Heart Healthy .  For Heart failure patients - Check your Weight same time everyday, if you gain over 2 pounds, or you develop in leg swelling, experience more shortness of breath or chest pain, call your Primary MD immediately. Follow Cardiac Low Salt Diet and 1.8 lit/day fluid restriction.   On your next visit with her primary care physician please Get Medicines reviewed and adjusted.  Please request your Prim.MD to go over all Hospital Tests and Procedure/Radiological results at the follow up, please get all Hospital records sent to your Prim MD by signing hospital release before you go home.   If you experience worsening of your admission symptoms, develop shortness of breath, life threatening emergency, suicidal or homicidal thoughts you must seek medical attention immediately by calling 911 or calling your MD immediately  if symptoms less severe.  You Must read complete instructions/literature along with all the possible adverse reactions/side effects for all the Medicines you take and that have been prescribed to you. Take any new Medicines after you have completely understood and accpet all the possible adverse reactions/side effects.   Do not drive and provide baby sitting services if your were admitted for syncope or siezures until you have seen by Primary MD or a Neurologist and advised to do so again.  Do not drive when taking Pain medications.    Do not take more than prescribed Pain, Sleep and Anxiety Medications  Special Instructions: If you have smoked or chewed Tobacco  in the last 2 yrs please stop smoking, stop any regular Alcohol  and or any Recreational drug use.  Wear Seat belts while driving.   Please note  You were cared for by a hospitalist during your hospital stay. If you have any questions about your discharge medications or the care you received while  you were in the hospital after you are discharged, you can call the unit and asked to speak with the hospitalist on call if the hospitalist that took care of you is not available. Once you are discharged, your primary care physician will handle any further medical issues. Please note that NO REFILLS for any discharge medications will be authorized once you are discharged, as it is imperative that you return to your primary care physician (or establish a relationship with a primary care physician if you do not have one) for your aftercare needs so that they can reassess your need for medications and monitor your lab values.  Follow with Primary MD  in 7 days   Get CBC, CMP  checked  by Primary MD next visit.    Activity: As tolerated with Full fall precautions use walker/cane & assistance as needed   Disposition Home     Diet: Heart Healthy .  For Heart failure patients - Check your Weight same time everyday, if you gain over 2 pounds, or you develop in leg swelling, experience more shortness of breath or chest pain, call your Primary MD immediately. Follow Cardiac Low Salt Diet and 1.8 lit/day fluid restriction.   On your next visit with her primary care physician please Get Medicines reviewed and adjusted.  Please request your Prim.MD to go over all Hospital Tests and Procedure/Radiological results at the follow up, please get all Hospital records sent to your Prim MD by signing hospital release before you go home.   If you experience worsening of your admission symptoms, develop shortness of breath, life threatening emergency, suicidal or homicidal thoughts you must seek medical attention immediately by calling 911 or calling your MD immediately  if symptoms less severe.  You Must read complete instructions/literature along with all the possible adverse reactions/side effects for all the Medicines you take and that have been prescribed to you. Take any new Medicines after you have  completely understood and accpet all the possible adverse reactions/side effects.   Do not drive and provide baby sitting services if your were admitted for syncope or siezures until you have seen by Primary MD or a Neurologist and advised to do so again.  Do not drive when taking Pain medications.    Do not take more than prescribed Pain, Sleep and Anxiety Medications  Special Instructions: If you have smoked or chewed Tobacco  in the last 2 yrs please stop smoking, stop any regular Alcohol  and or any Recreational  drug use.  Wear Seat belts while driving.   Please note  You were cared for by a hospitalist during your hospital stay. If you have any questions about your discharge medications or the care you received while you were in the hospital after you are discharged, you can call the unit and asked to speak with the hospitalist on call if the hospitalist that took care of you is not available. Once you are discharged, your primary care physician will handle any further medical issues. Please note that NO REFILLS for any discharge medications will be authorized once you are discharged, as it is imperative that you return to your primary care physician (or establish a relationship with a primary care physician if you do not have one) for your aftercare needs so that they can reassess your need for medications and monitor your lab values.     Increase activity slowly    Complete by:  As directed             Medication List         albuterol 108 (90 BASE) MCG/ACT inhaler  Commonly known as:  PROVENTIL HFA;VENTOLIN HFA  Inhale 2 puffs into the lungs every 4 (four) hours as needed for wheezing or shortness of breath.     amLODipine 5 MG tablet  Commonly known as:  NORVASC  Take 1 tablet (5 mg total) by mouth daily.     amoxicillin-clavulanate 875-125 MG per tablet  Commonly known as:  AUGMENTIN  Take 1 tablet by mouth 2 (two) times daily.     aspirin EC 81 MG tablet  Take 1  tablet (81 mg total) by mouth daily.     chlorpheniramine-HYDROcodone 10-8 MG/5ML Lqcr  Commonly known as:  TUSSIONEX  Take 5 mLs by mouth every 12 (twelve) hours as needed for cough.     multivitamin Tabs tablet  Take 1 tablet by mouth daily.     nicotine 14 mg/24hr patch  Commonly known as:  NICODERM CQ - dosed in mg/24 hours  Place 1 patch (14 mg total) onto the skin daily.     oxycodone 5 MG capsule  Commonly known as:  OXY-IR  Take 5 mg by mouth every 4 (four) hours as needed for pain.     predniSONE 20 MG tablet  Commonly known as:  DELTASONE  3 tabs po day one, then 2 po daily x 4 days     predniSONE 20 MG tablet  Commonly known as:  DELTASONE  Take 1 tablet (20 mg total) by mouth 2 (two) times daily.     valsartan 80 MG tablet  Commonly known as:  DIOVAN  Take 80 mg by mouth daily.          Diet and Activity recommendation: See Discharge Instructions above   Consults obtained - neuro   Major procedures and Radiology Reports - PLEASE review detailed and final reports for all details, in brief -   Echogram   - Left ventricle: The cavity size was normal. Systolic function was vigorous. The estimated ejection fraction was in the range of 65% to 70%. Wall motion was normal; there were no regional wall motion abnormalities. Left ventricular diastolic function parameters were normal. - Aortic valve: Trileaflet; normal thickness leaflets. There was no regurgitation. - Mitral valve: There was no regurgitation. - Left atrium: The atrium was normal in size. - Right ventricle: Systolic function was normal. - Right atrium: The atrium was normal in size. - Tricuspid valve: There  was no regurgitation. - Pulmonary arteries: Systolic pressure was within the normal range. - Inferior vena cava: The vessel was normal in size. - Pericardium, extracardiac: There was no pericardial effusion.  Impressions:  - Normal study.     EEG  Impression: this is a normal  awake and asleep EEG. Please, be aware that a normal EEG does not exclude the possibility of epilepsy.  Clinical correlation is advised.        Dg Chest 2 View  11/18/2013   CLINICAL DATA:  Left-sided numbness  EXAM: CHEST  2 VIEW  COMPARISON:  07/17/2013  FINDINGS: Normal cardiac silhouette with ectatic aorta. No effusion, infiltrate, or pneumothorax. No acute osseous abnormality. Prior cholecystectomy.  IMPRESSION: No acute cardiopulmonary process.   Electronically Signed   By: Genevive BiStewart  Edmunds M.D.   On: 11/18/2013 08:14   Ct Head Wo Contrast  11/18/2013   CLINICAL DATA:  Left-sided numbness and tingling  EXAM: CT HEAD WITHOUT CONTRAST  TECHNIQUE: Contiguous axial images were obtained from the base of the skull through the vertex without intravenous contrast.  COMPARISON:  Head CT 07/26/2008  FINDINGS: No acute intracranial hemorrhage. No focal mass lesion. No CT evidence of acute infarction. No midline shift or mass effect. No hydrocephalus. Basilar cisterns are patent.  IMPRESSION: Normal head CT   Electronically Signed   By: Genevive BiStewart  Edmunds M.D.   On: 11/18/2013 08:10   Mr Maxine GlennMra Head Wo Contrast  11/18/2013   CLINICAL DATA:  Left arm tingling.  Rule out stroke  EXAM: MRI HEAD WITHOUT AND WITH CONTRAST  MRA HEAD WITHOUT CONTRAST  MRA NECK WITHOUT AND WITH CONTRAST  TECHNIQUE: Multiplanar, multiecho pulse sequences of the brain and surrounding structures were obtained without and with intravenous contrast. Angiographic images of the Circle of Willis were obtained using MRA technique without intravenous contrast. Angiographic images of the neck were obtained using MRA technique without and with intravenous contrast. Carotid stenosis measurements (when applicable) are obtained utilizing NASCET criteria, using the distal internal carotid diameter as the denominator.  CONTRAST:  20mL MULTIHANCE GADOBENATE DIMEGLUMINE 529 MG/ML IV SOLN  COMPARISON:  CT head 11/18/2013  FINDINGS: MRI HEAD FINDINGS   Negative for acute infarct. Negative for chronic infarct. Cerebral white matter is normal. Negative for demyelinating disease.  Ventricle size is normal. Negative for hemorrhage or mass. Postcontrast imaging degraded by significant motion, however no enhancing lesion is identified.  MRA HEAD FINDINGS  Mild motion.  Both vertebral arteries are patent to the basilar. PICA patent bilaterally. Basilar widely patent. Superior cerebellar and posterior cerebral arteries are widely patent bilaterally.  Internal carotid artery patent bilaterally without stenosis. Anterior and middle cerebral arteries patent without stenosis  Negative for cerebral aneurysm.  MRA NECK FINDINGS  Standard branching of the aortic arch. Proximal great vessels are widely patent. Carotid bifurcation widely patent. Both vertebral arteries are equal in size and widely patent to the basilar. No carotid or vertebral stenosis or dissection.  IMPRESSION: Negative MRI of the head with contrast  Negative MRA of the head  Negative MRA of the neck   Electronically Signed   By: Marlan Palauharles  Clark M.D.   On: 11/18/2013 21:08   Mr Angiogram Neck W Wo Contrast  11/18/2013   CLINICAL DATA:  Left arm tingling.  Rule out stroke  EXAM: MRI HEAD WITHOUT AND WITH CONTRAST  MRA HEAD WITHOUT CONTRAST  MRA NECK WITHOUT AND WITH CONTRAST  TECHNIQUE: Multiplanar, multiecho pulse sequences of the brain and surrounding structures were obtained without  and with intravenous contrast. Angiographic images of the Circle of Willis were obtained using MRA technique without intravenous contrast. Angiographic images of the neck were obtained using MRA technique without and with intravenous contrast. Carotid stenosis measurements (when applicable) are obtained utilizing NASCET criteria, using the distal internal carotid diameter as the denominator.  CONTRAST:  20mL MULTIHANCE GADOBENATE DIMEGLUMINE 529 MG/ML IV SOLN  COMPARISON:  CT head 11/18/2013  FINDINGS: MRI HEAD FINDINGS  Negative  for acute infarct. Negative for chronic infarct. Cerebral white matter is normal. Negative for demyelinating disease.  Ventricle size is normal. Negative for hemorrhage or mass. Postcontrast imaging degraded by significant motion, however no enhancing lesion is identified.  MRA HEAD FINDINGS  Mild motion.  Both vertebral arteries are patent to the basilar. PICA patent bilaterally. Basilar widely patent. Superior cerebellar and posterior cerebral arteries are widely patent bilaterally.  Internal carotid artery patent bilaterally without stenosis. Anterior and middle cerebral arteries patent without stenosis  Negative for cerebral aneurysm.  MRA NECK FINDINGS  Standard branching of the aortic arch. Proximal great vessels are widely patent. Carotid bifurcation widely patent. Both vertebral arteries are equal in size and widely patent to the basilar. No carotid or vertebral stenosis or dissection.  IMPRESSION: Negative MRI of the head with contrast  Negative MRA of the head  Negative MRA of the neck   Electronically Signed   By: Marlan Palau M.D.   On: 11/18/2013 21:08   Mr Laqueta Jean ZO Contrast  11/18/2013   CLINICAL DATA:  Left arm tingling.  Rule out stroke  EXAM: MRI HEAD WITHOUT AND WITH CONTRAST  MRA HEAD WITHOUT CONTRAST  MRA NECK WITHOUT AND WITH CONTRAST  TECHNIQUE: Multiplanar, multiecho pulse sequences of the brain and surrounding structures were obtained without and with intravenous contrast. Angiographic images of the Circle of Willis were obtained using MRA technique without intravenous contrast. Angiographic images of the neck were obtained using MRA technique without and with intravenous contrast. Carotid stenosis measurements (when applicable) are obtained utilizing NASCET criteria, using the distal internal carotid diameter as the denominator.  CONTRAST:  20mL MULTIHANCE GADOBENATE DIMEGLUMINE 529 MG/ML IV SOLN  COMPARISON:  CT head 11/18/2013  FINDINGS: MRI HEAD FINDINGS  Negative for acute  infarct. Negative for chronic infarct. Cerebral white matter is normal. Negative for demyelinating disease.  Ventricle size is normal. Negative for hemorrhage or mass. Postcontrast imaging degraded by significant motion, however no enhancing lesion is identified.  MRA HEAD FINDINGS  Mild motion.  Both vertebral arteries are patent to the basilar. PICA patent bilaterally. Basilar widely patent. Superior cerebellar and posterior cerebral arteries are widely patent bilaterally.  Internal carotid artery patent bilaterally without stenosis. Anterior and middle cerebral arteries patent without stenosis  Negative for cerebral aneurysm.  MRA NECK FINDINGS  Standard branching of the aortic arch. Proximal great vessels are widely patent. Carotid bifurcation widely patent. Both vertebral arteries are equal in size and widely patent to the basilar. No carotid or vertebral stenosis or dissection.  IMPRESSION: Negative MRI of the head with contrast  Negative MRA of the head  Negative MRA of the neck   Electronically Signed   By: Marlan Palau M.D.   On: 11/18/2013 21:08   Mr Cervical Spine W Wo Contrast  11/18/2013   CLINICAL DATA:  Left arm tingling  EXAM: MRI CERVICAL SPINE WITHOUT AND WITH CONTRAST  TECHNIQUE: Multiplanar and multiecho pulse sequences of the cervical spine, to include the craniocervical junction and cervicothoracic junction, were obtained according to  standard protocol without and with intravenous contrast.  CONTRAST:  20mL MULTIHANCE GADOBENATE DIMEGLUMINE 529 MG/ML IV SOLN  COMPARISON:  None.  FINDINGS: Image quality degraded by motion. The postcontrast images are particularly degraded by motion.  Normal cervical alignment. Negative for fracture or mass. No cord compression. No cord lesion identified. Craniocervical junction normal.  C2-3:  Negative  C3-4: Mild disc degeneration and spondylosis with mild foraminal stenosis bilaterally  C4-5:  Mild disc degeneration without stenosis  C5-6:  Mild  spondylosis.  Left foraminal narrowing due to spurring  C6-7:  Negative  C7-T1:  Negative  IMPRESSION: Image quality degraded by motion.  Mild cervical degenerative changes. Mild left foraminal encroachment due to uncinate spurring.   Electronically Signed   By: Marlan Palau M.D.   On: 11/18/2013 21:12    Micro Results     No results found for this or any previous visit (from the past 240 hour(s)).   History of present illness and  Hospital Course:     Kindly see H&P for history of present illness and admission details, please review complete Labs, Consult reports and Test reports for all details in brief Daniel Valencia, is a 48 y.o. male, patient with history of hypertension, smoking, dyslipidemia, asthma was to the hospital with left arm tingling and numbness due to unclear etiology. He was seen by neurology, underwent CT scan of the head, MRI of the brain along with EEG which were all unremarkable. Echogram was unremarkable as well. He was seen by neurologist Dr.Camillo today and cleared for home.   Left arm tingling numbness is still unclear etiology, TIA stroke workup has been unremarkable, per neurology no need for carotid duplex, this could be peripheral neuropathy and I will have him follow with urologist in the outpatient, he will be placed on low-dose aspirin, since his lipid panel was not goal he will be placed on a statin as well. His symptoms now have completely resolved. He was counseled to quit smoking.    Dyslipidemia placed on statin.    Smoking counseled to quit, patch given.      Today   Subjective:   Daniel Valencia today has no headache,no chest abdominal pain,no new weakness tingling or numbness, feels much better wants to go home today.   Objective:   Blood pressure 127/81, pulse 89, temperature 98.9 F (37.2 C), temperature source Oral, resp. rate 18, height 5\' 9"  (1.753 m), weight 88.451 kg (195 lb), SpO2 96.00%.   Intake/Output Summary (Last 24 hours) at  11/19/13 1127 Last data filed at 11/19/13 1100  Gross per 24 hour  Intake    500 ml  Output    200 ml  Net    300 ml    Exam Awake Alert, Oriented x 3, No new F.N deficits, Normal affect [place].AT,PERRAL Supple Neck,No JVD, No cervical lymphadenopathy appriciated.  Symmetrical Chest wall movement, Good air movement bilaterally, CTAB RRR,No Gallops,Rubs or new Murmurs, No Parasternal Heave +ve B.Sounds, Abd Soft, Non tender, No organomegaly appriciated, No rebound -guarding or rigidity. No Cyanosis, Clubbing or edema, No new Rash or bruise  Data Review   CBC w Diff: Lab Results  Component Value Date   WBC 6.1 11/18/2013   HGB 15.5 11/18/2013   HCT 42.6 11/18/2013   PLT 275 11/18/2013   LYMPHOPCT 47* 11/18/2013   MONOPCT 10 11/18/2013   EOSPCT 10* 11/18/2013   BASOPCT 0 11/18/2013    CMP: Lab Results  Component Value Date   NA 140 11/18/2013  K 3.7 11/18/2013   CL 102 11/18/2013   CO2 24 11/18/2013   BUN 19 11/18/2013   CREATININE 0.80 11/18/2013   PROT 7.4 11/18/2013   ALBUMIN 3.9 11/18/2013   BILITOT 0.4 11/18/2013   ALKPHOS 90 11/18/2013   AST 18 11/18/2013   ALT 40 11/18/2013  . Lab Results  Component Value Date   CHOL 229* 11/19/2013   HDL 50 11/19/2013   LDLCALC 149* 11/19/2013   LDLDIRECT 153.6 11/12/2011   TRIG 148 11/19/2013   CHOLHDL 4.6 11/19/2013    No results found for this basename: HGBA1C     Total Time in preparing paper work, data evaluation and todays exam - 35 minutes  Leroy Sea M.D on 11/19/2013 at 11:27 AM  [facility] Office  [phone]   **Disclaimer: This note may have been dictated with voice recognition software. Similar sounding words can inadvertently be transcribed and this note may contain transcription errors which may not have been corrected upon publication of note.**

## 2013-11-19 NOTE — Progress Notes (Signed)
Patient appears nervous and anxious, found him using albuterol nebulizer and asked him how many times a day he uses it and he could not say, do not see anything that says he is to have it for self medication while in hospital. i asked if he would give it to me to hold for him and he would not. I asked him not to use it anymore and he is in a camera room and as far as I can tell he is no longer using it, calling pharmacy for recommendation. Will continue to monitor.

## 2013-11-19 NOTE — Discharge Instructions (Signed)
Follow with Primary MD  in 7 days   Get CBC, CMP  checked  by Primary MD next visit.    Activity: As tolerated with Full fall precautions use walker/cane & assistance as needed   Disposition Home     Diet: Heart Healthy .  For Heart failure patients - Check your Weight same time everyday, if you gain over 2 pounds, or you develop in leg swelling, experience more shortness of breath or chest pain, call your Primary MD immediately. Follow Cardiac Low Salt Diet and 1.8 lit/day fluid restriction.   On your next visit with her primary care physician please Get Medicines reviewed and adjusted.  Please request your Prim.MD to go over all Hospital Tests and Procedure/Radiological results at the follow up, please get all Hospital records sent to your Prim MD by signing hospital release before you go home.   If you experience worsening of your admission symptoms, develop shortness of breath, life threatening emergency, suicidal or homicidal thoughts you must seek medical attention immediately by calling 911 or calling your MD immediately  if symptoms less severe.  You Must read complete instructions/literature along with all the possible adverse reactions/side effects for all the Medicines you take and that have been prescribed to you. Take any new Medicines after you have completely understood and accpet all the possible adverse reactions/side effects.   Do not drive and provide baby sitting services if your were admitted for syncope or siezures until you have seen by Primary MD or a Neurologist and advised to do so again.  Do not drive when taking Pain medications.    Do not take more than prescribed Pain, Sleep and Anxiety Medications  Special Instructions: If you have smoked or chewed Tobacco  in the last 2 yrs please stop smoking, stop any regular Alcohol  and or any Recreational drug use.  Wear Seat belts while driving.   Please note  You were cared for by a hospitalist during your  hospital stay. If you have any questions about your discharge medications or the care you received while you were in the hospital after you are discharged, you can call the unit and asked to speak with the hospitalist on call if the hospitalist that took care of you is not available. Once you are discharged, your primary care physician will handle any further medical issues. Please note that NO REFILLS for any discharge medications will be authorized once you are discharged, as it is imperative that you return to your primary care physician (or establish a relationship with a primary care physician if you do not have one) for your aftercare needs so that they can reassess your need for medications and monitor your lab values.

## 2013-11-20 NOTE — ED Provider Notes (Signed)
Medical screening examination/treatment/procedure(s) were performed by non-physician practitioner and as supervising physician I was immediately available for consultation/collaboration.   EKG Interpretation   Date/Time:  Wednesday November 18 2013 07:46:01 EDT Ventricular Rate:  97 PR Interval:  178 QRS Duration: 88 QT Interval:  338 QTC Calculation: 429 R Axis:   21 Text Interpretation:  Normal sinus rhythm Normal ECG Confirmed by Blinda LeatherwoodPOLLINA   MD, CHRISTOPHER ([id number]) on 11/18/2013 7:54:07 AM        Audree CamelScott T Artelia Game, MD 11/20/13 [phone]

## 2013-12-14 ENCOUNTER — Encounter: Payer: Self-pay | Admitting: Diagnostic Neuroimaging

## 2013-12-14 ENCOUNTER — Ambulatory Visit (INDEPENDENT_AMBULATORY_CARE_PROVIDER_SITE_OTHER): Payer: 59 | Admitting: Diagnostic Neuroimaging

## 2013-12-14 VITALS — BP 130/96 | HR 76 | Temp 97.1°F | Ht 69.0 in | Wt 197.0 lb

## 2013-12-14 DIAGNOSIS — R209 Unspecified disturbances of skin sensation: Secondary | ICD-10-CM

## 2013-12-14 DIAGNOSIS — IMO0002 Reserved for concepts with insufficient information to code with codable children: Secondary | ICD-10-CM

## 2013-12-14 DIAGNOSIS — R200 Anesthesia of skin: Secondary | ICD-10-CM

## 2013-12-14 DIAGNOSIS — M5416 Radiculopathy, lumbar region: Secondary | ICD-10-CM

## 2013-12-14 MED ORDER — GABAPENTIN 300 MG PO CAPS
300.0000 mg | ORAL_CAPSULE | Freq: Every day | ORAL | Status: DC
Start: 2013-12-14 — End: 2017-07-08

## 2013-12-14 NOTE — Patient Instructions (Signed)
Start gabapentin 300mg  at bedtime. After 1-2 weeks, may increase to twice a day.  I will setup EMG (electrical nerve test).   Start physical therapy exercises at home, 10-20 minutes per day.

## 2013-12-14 NOTE — Progress Notes (Addendum)
[facility]  PATIENT: Daniel Valencia DOB: [birth year]  REFERRING CLINICIAN: C Fulp HISTORY FROM: patient  REASON FOR VISIT: new consult    HISTORICAL  CHIEF COMPLAINT:  Chief Complaint  Patient presents with  . Back Pain    HISTORY OF PRESENT ILLNESS:   48 year old right-handed male with hypertension, hypercholesterolemia, here for evaluation of left arm numbness and low back pain.  Chronic low back pain for/several years, with recent flareup. He has pain that radiates from his low back into his left leg. In the past his left leg is "given out". Patient had MRI of the lumbar spine in 2013 which was unremarkable. Apparently he had another MRI lumbar spine in 2015 which showed some degenerative disc disease. He is using oxycodone as needed for pain relief. Pain wakes him up at nighttime.  Last month patient had new onset left arm numbness and tingling. Symptoms radiate from his left shoulder down to his left hand. Sometimes he shakes his hand to get relief. Patient went to the emergency room and was admitted for TIA workup. MRI of the brain, cervical spine, MRA head and neck, echocardiogram were all unremarkable. Patient had EEG which was also unremarkable. Patient was diagnosed with possible TIA versus cervical radiculopathy versus compressive neuropathy. Patient continues to have intermittent fluctuating numbness in his left arm.   REVIEW OF SYSTEMS:  Full 14 system review of systems performed and notable only for  numbness.   ALLERGIES: Allergies  Allergen Reactions  . Peanuts [Peanut Oil] Anaphylaxis  . Acetaminophen Other (See Comments)    Migraines.    HOME MEDICATIONS: Outpatient Prescriptions Prior to Visit  Medication Sig Dispense Refill  . albuterol (PROVENTIL HFA;VENTOLIN HFA) 108 (90 BASE) MCG/ACT inhaler Inhale 2 puffs into the lungs every 4 (four) hours as needed for wheezing or shortness of breath.  1 Inhaler  1  . amLODipine-olmesartan (AZOR)  5-40 MG per tablet Take 1 tablet by mouth daily.      . multivitamin (ONE-A-DAY MEN'S) TABS Take 1 tablet by mouth daily.      . nicotine (NICODERM CQ - DOSED IN MG/24 HOURS) 14 mg/24hr patch Place 1 patch (14 mg total) onto the skin daily.  28 patch  0  . oxycodone (OXY-IR) 5 MG capsule Take 5 mg by mouth every 4 (four) hours as needed for pain.       . valsartan (DIOVAN) 80 MG tablet Take 80 mg by mouth daily.      Marland Kitchen amLODipine (NORVASC) 5 MG tablet Take 1 tablet (5 mg total) by mouth daily.  30 tablet  0  . amoxicillin-clavulanate (AUGMENTIN) 875-125 MG per tablet Take 1 tablet by mouth 2 (two) times daily.  20 tablet  0  . aspirin EC 81 MG tablet Take 1 tablet (81 mg total) by mouth daily.  30 tablet  0  . chlorpheniramine-HYDROcodone (TUSSIONEX) 10-8 MG/5ML LQCR Take 5 mLs by mouth every 12 (twelve) hours as needed for cough.  140 mL  0  . LORazepam (ATIVAN) 1 MG tablet Take 1 mg by mouth 2 (two) times daily as needed for anxiety.      . predniSONE (DELTASONE) 20 MG tablet 3 tabs po day one, then 2 po daily x 4 days  11 tablet  0  . predniSONE (DELTASONE) 20 MG tablet Take 1 tablet (20 mg total) by mouth 2 (two) times daily.  10 tablet  0   No facility-administered medications prior to visit.    PAST MEDICAL  HISTORY: Past Medical History  Diagnosis Date  . Hypertension   . Unspecified asthma(493.90)   . Hyperlipidemia   . Anxiety 12/30/2011  . High cholesterol   . DDD (degenerative disc disease)     PAST SURGICAL HISTORY: Past Surgical History  Procedure Laterality Date  . Cholecystectomy    . Right leg surgury      pin in right leg  . Right foot surgury  apirl 2013    [facility]    FAMILY HISTORY: Family History  Problem Relation Age of Onset  . Hypertension Father   . Diabetes Sister   . High blood pressure Sister   . High blood pressure Sister   . Stroke Neg Hx   . Heart attack Neg Hx     SOCIAL HISTORY:  History   Social History  . Marital Status: Single      Spouse Name: N/A    Number of Children: 0  . Years of Education: HS   Occupational History  . vice president business control [employer]   Social History Main Topics  . Smoking status: Current Every Day Smoker -- 1.00 packs/day for 32 years    Types: Cigarettes  . Smokeless tobacco: Never Used  . Alcohol Use: Yes     Comment: occasional beer  . Drug Use: No  . Sexual Activity: Yes    Birth Control/ Protection: Condom   Other Topics Concern  . Not on file   Social History Narrative   Patient lives at home with his roommate.   Caffeine Use: Yes     PHYSICAL EXAM  Filed Vitals:   12/14/13 0918  BP: 130/96  Pulse: 76  Temp: 97.1 F (36.2 C)  TempSrc: Oral  Height: 5\' 9"  (1.753 m)  Weight: 197 lb (89.359 kg)    Not recorded    Body mass index is 29.08 kg/(m^2).  GENERAL EXAM: Patient is in no distress; well developed, nourished and groomed; neck is supple  CARDIOVASCULAR: Regular rate and rhythm, no murmurs, no carotid bruits  NEUROLOGIC: MENTAL STATUS: awake, alert, oriented to person, place and time, recent and remote memory intact, normal attention and concentration, language fluent, comprehension intact, naming intact, fund of knowledge appropriate CRANIAL NERVE: no papilledema on fundoscopic exam, pupils equal and reactive to light, visual fields full to confrontation, extraocular muscles intact, no nystagmus, facial sensation and strength symmetric, hearing intact, palate elevates symmetrically, uvula midline, shoulder shrug symmetric, tongue midline. MOTOR: normal bulk and tone, full strength in the BUE, BLE SENSORY: normal and symmetric to light touch, pinprick, temperature, vibration; EXCEPT DECR PP, TEMP IN LEFT ARM. POSITIVE PHALENS IN LEFT HAND; NEG TINELS. COORDINATION: finger-nose-finger, fine finger movements normal REFLEXES: BUE 1, RIGHT LEG 1, LEFT LEG 0.  GAIT/STATION: narrow based gait; able to walk on toes; DIFF WITH HEEL GAIT ON LEFT  FOOT. Romberg is negative    DIAGNOSTIC DATA (LABS, IMAGING, TESTING) - I reviewed patient records, labs, notes, testing and imaging myself where available.  Lab Results  Component Value Date   WBC 6.1 11/18/2013   HGB 15.5 11/18/2013   HCT 42.6 11/18/2013   MCV 91.2 11/18/2013   PLT 275 11/18/2013      Component Value Date/Time   NA 140 11/18/2013 0755   K 3.7 11/18/2013 0755   CL 102 11/18/2013 0755   CO2 24 11/18/2013 0755   GLUCOSE 123* 11/18/2013 0755   BUN 19 11/18/2013 0755   CREATININE 0.80 11/18/2013 0755   CALCIUM 9.4  11/18/2013 0755   PROT 7.4 11/18/2013 0755   ALBUMIN 3.9 11/18/2013 0755   AST 18 11/18/2013 0755   ALT 40 11/18/2013 0755   ALKPHOS 90 11/18/2013 0755   BILITOT 0.4 11/18/2013 0755   GFRNONAA >90 11/18/2013 0755   GFRAA >90 11/18/2013 0755   Lab Results  Component Value Date   CHOL 229* 11/19/2013   HDL 50 11/19/2013   LDLCALC 149* 11/19/2013   LDLDIRECT 153.6 11/12/2011   TRIG 148 11/19/2013   CHOLHDL 4.6 11/19/2013   Lab Results  Component Value Date   HGBA1C 5.8* 11/19/2013   No results found for this basename: VITAMINB12   Lab Results  Component Value Date   TSH 0.41 11/12/2011    I reviewed images myself and agree with interpretation. -VRP  11/18/13 MRI BRAIN - normal  11/18/13 MRA HEAD - normal  11/18/13 MRA NECK - normal   11/18/13 MRI CERVICAL SPINE - Mild cervical degenerative changes. Mild left foraminal encroachment due to uncinate spurring.  01/17/12 MRI LUMBAR SPINE - No significant abnormality of the lumbar spine.  11/18/13 TTE - normal  11/19/13 EEG - normal    ASSESSMENT AND PLAN  48 y.o. year old male here with intermittent numbness/pain in left arm/hand. Also with acute on chronic LBP radiating to left leg.   Dx left arm numbness: carpal tunnel syndrome vs cervical radiculopathy  Dx low back pain to left leg: left lumbar radiculopathy (L4-S1)  PLAN: - try gabapentin 300mg  qhs - BID; may titrate up as needed - resume PT exercises  at home - EMG/NCS  Orders Placed This Encounter  Procedures  . NCV with EMG(electromyography)   Meds ordered this encounter  Medications  . gabapentin (NEURONTIN) 300 MG capsule    Sig: Take 1 capsule (300 mg total) by mouth at bedtime.    Dispense:  30 capsule    Refill:  3   Return for for NCV/EMG.    Suanne MarkerVIKRAM R. Demontay Grantham, MD 12/14/2013, 10:04 AM Certified in Neurology, Neurophysiology and Neuroimaging  [facility] [address], [address] [place], [place] [address] [phone]

## 2014-01-04 ENCOUNTER — Telehealth: Payer: Self-pay | Admitting: Radiology

## 2014-01-04 ENCOUNTER — Encounter: Payer: 59 | Admitting: Diagnostic Neuroimaging

## 2014-01-04 ENCOUNTER — Encounter: Payer: 59 | Admitting: Radiology

## 2014-01-04 NOTE — Telephone Encounter (Signed)
No show for NCV/EMG study on 01/04/14.

## 2014-01-11 ENCOUNTER — Encounter: Payer: Self-pay | Admitting: Diagnostic Neuroimaging

## 2014-01-21 ENCOUNTER — Encounter (HOSPITAL_BASED_OUTPATIENT_CLINIC_OR_DEPARTMENT_OTHER): Payer: Self-pay | Admitting: Emergency Medicine

## 2014-01-21 ENCOUNTER — Emergency Department (HOSPITAL_BASED_OUTPATIENT_CLINIC_OR_DEPARTMENT_OTHER)
Admission: EM | Admit: 2014-01-21 | Discharge: 2014-01-21 | Disposition: A | Discharge status: Home / Self Care | Payer: 59 | Attending: Emergency Medicine | Admitting: Emergency Medicine

## 2014-01-21 DIAGNOSIS — R110 Nausea: Secondary | ICD-10-CM | POA: Diagnosis not present

## 2014-01-21 DIAGNOSIS — F411 Generalized anxiety disorder: Secondary | ICD-10-CM | POA: Diagnosis not present

## 2014-01-21 DIAGNOSIS — Z9089 Acquired absence of other organs: Secondary | ICD-10-CM | POA: Diagnosis not present

## 2014-01-21 DIAGNOSIS — R1013 Epigastric pain: Secondary | ICD-10-CM | POA: Diagnosis not present

## 2014-01-21 DIAGNOSIS — K921 Melena: Secondary | ICD-10-CM | POA: Insufficient documentation

## 2014-01-21 DIAGNOSIS — F17200 Nicotine dependence, unspecified, uncomplicated: Secondary | ICD-10-CM | POA: Diagnosis not present

## 2014-01-21 DIAGNOSIS — Z8639 Personal history of other endocrine, nutritional and metabolic disease: Secondary | ICD-10-CM | POA: Diagnosis not present

## 2014-01-21 DIAGNOSIS — Z8739 Personal history of other diseases of the musculoskeletal system and connective tissue: Secondary | ICD-10-CM | POA: Insufficient documentation

## 2014-01-21 DIAGNOSIS — I10 Essential (primary) hypertension: Secondary | ICD-10-CM | POA: Insufficient documentation

## 2014-01-21 DIAGNOSIS — Z79899 Other long term (current) drug therapy: Secondary | ICD-10-CM | POA: Diagnosis not present

## 2014-01-21 DIAGNOSIS — Z862 Personal history of diseases of the blood and blood-forming organs and certain disorders involving the immune mechanism: Secondary | ICD-10-CM | POA: Diagnosis not present

## 2014-01-21 DIAGNOSIS — R109 Unspecified abdominal pain: Secondary | ICD-10-CM

## 2014-01-21 DIAGNOSIS — J45909 Unspecified asthma, uncomplicated: Secondary | ICD-10-CM | POA: Insufficient documentation

## 2014-01-21 LAB — CBC WITH DIFFERENTIAL/PLATELET
BASOS ABS: 0 10*3/uL (ref 0.0–0.1)
BASOS PCT: 0 % (ref 0–1)
EOS PCT: 10 % — AB (ref 0–5)
Eosinophils Absolute: 0.5 10*3/uL (ref 0.0–0.7)
HEMATOCRIT: 44.6 % (ref 39.0–52.0)
Hemoglobin: 15.6 g/dL (ref 13.0–17.0)
Lymphocytes Relative: 43 % (ref 12–46)
Lymphs Abs: 2.2 10*3/uL (ref 0.7–4.0)
MCH: 31.8 pg (ref 26.0–34.0)
MCHC: 35 g/dL (ref 30.0–36.0)
MCV: 91 fL (ref 78.0–100.0)
MONO ABS: 0.6 10*3/uL (ref 0.1–1.0)
Monocytes Relative: 12 % (ref 3–12)
Neutro Abs: 1.7 10*3/uL (ref 1.7–7.7)
Neutrophils Relative %: 35 % — ABNORMAL LOW (ref 43–77)
Platelets: 290 10*3/uL (ref 150–400)
RBC: 4.9 MIL/uL (ref 4.22–5.81)
RDW: 14 % (ref 11.5–15.5)
WBC: 5 10*3/uL (ref 4.0–10.5)

## 2014-01-21 LAB — URINALYSIS, ROUTINE W REFLEX MICROSCOPIC
Bilirubin Urine: NEGATIVE
GLUCOSE, UA: NEGATIVE mg/dL
HGB URINE DIPSTICK: NEGATIVE
Ketones, ur: NEGATIVE mg/dL
LEUKOCYTES UA: NEGATIVE
Nitrite: NEGATIVE
PROTEIN: NEGATIVE mg/dL
Specific Gravity, Urine: 1.025 (ref 1.005–1.030)
Urobilinogen, UA: 0.2 mg/dL (ref 0.0–1.0)
pH: 5 (ref 5.0–8.0)

## 2014-01-21 LAB — COMPREHENSIVE METABOLIC PANEL
ALBUMIN: 3.9 g/dL (ref 3.5–5.2)
ALT: 41 U/L (ref 0–53)
AST: 21 U/L (ref 0–37)
Alkaline Phosphatase: 96 U/L (ref 39–117)
Anion gap: 11 (ref 5–15)
BUN: 17 mg/dL (ref 6–23)
CALCIUM: 9.7 mg/dL (ref 8.4–10.5)
CO2: 27 mEq/L (ref 19–32)
CREATININE: 1 mg/dL (ref 0.50–1.35)
Chloride: 103 mEq/L (ref 96–112)
GFR calc Af Amer: >90 mL/min (ref >90)
GFR calc non Af Amer: 87 mL/min — ABNORMAL LOW (ref >90)
Glucose, Bld: 104 mg/dL — ABNORMAL HIGH (ref 70–99)
Potassium: 4.4 mEq/L (ref 3.7–5.3)
Sodium: 141 mEq/L (ref 137–147)
Total Bilirubin: 0.3 mg/dL (ref 0.3–1.2)
Total Protein: 7.6 g/dL (ref 6.0–8.3)

## 2014-01-21 LAB — LIPASE, BLOOD: LIPASE: 39 U/L (ref 11–59)

## 2014-01-21 LAB — OCCULT BLOOD X 1 CARD TO LAB, STOOL: Fecal Occult Bld: NEGATIVE

## 2014-01-21 MED ORDER — ONDANSETRON HCL 4 MG/2ML IJ SOLN
4.0000 mg | Freq: Once | INTRAMUSCULAR | Status: AC
  Administered 2014-01-21: 4 mg via INTRAVENOUS
  Filled 2014-01-21: qty 2

## 2014-01-21 MED ORDER — SUCRALFATE 1 G PO TABS: 1.0000 g | ORAL_TABLET | Freq: Two times a day (BID) | ORAL | Status: DC

## 2014-01-21 MED ORDER — SODIUM CHLORIDE 0.9 % IV BOLUS (SEPSIS)
1000.0000 mL | Freq: Once | INTRAVENOUS | Status: AC
  Administered 2014-01-21: 1000 mL via INTRAVENOUS

## 2014-01-21 MED ORDER — FAMOTIDINE 20 MG PO TABS
20.0000 mg | ORAL_TABLET | Freq: Once | ORAL | Status: AC
  Administered 2014-01-21: 20 mg via ORAL
  Filled 2014-01-21: qty 1

## 2014-01-21 MED ORDER — ESOMEPRAZOLE MAGNESIUM 40 MG PO CPDR: 40.0000 mg | DELAYED_RELEASE_CAPSULE | Freq: Every day | ORAL | Status: DC

## 2014-01-21 NOTE — ED Notes (Signed)
Pt c/o diffuse abd pain with nausea only also reports "black stools" x 4 days

## 2014-01-21 NOTE — Discharge Instructions (Signed)
Avoid narcotics and motrin.   Take tylenol for pain.   Take nexium daily.   Take carafate as needed.   Follow up with [facility] to schedule endoscopy.   Return to ER if you have worse bleeding, severe pain, fevers, vomiting.

## 2014-01-21 NOTE — ED Provider Notes (Signed)
CSN: [id number]     Arrival date & time 01/21/14  1433 History   First MD Initiated Contact with Patient 01/21/14 1506     Chief Complaint  Patient presents with  . Abdominal Pain     (Consider location/radiation/quality/duration/timing/severity/associated sxs/prior Treatment) The history is provided by the patient.  Beryle BeamsDirk Douse is a 48 y.o. male hx of HTN, HL here with black stools, nausea, abdominal pain. Epigastric pain and nausea for several days. Also has black stools for 4 days. No vomiting. No fevers. Has been taking oxycodone for pain but not on NSAIDs or iron.    Past Medical History  Diagnosis Date  . Hypertension   . Unspecified asthma(493.90)   . Hyperlipidemia   . Anxiety 12/30/2011  . High cholesterol   . DDD (degenerative disc disease)    Past Surgical History  Procedure Laterality Date  . Cholecystectomy    . Right leg surgury      pin in right leg  . Right foot surgury  apirl 2013    [facility]   Family History  Problem Relation Age of Onset  . Hypertension Father   . Diabetes Sister   . High blood pressure Sister   . High blood pressure Sister   . Stroke Neg Hx   . Heart attack Neg Hx    History  Substance Use Topics  . Smoking status: Current Every Day Smoker -- 1.00 packs/day for 32 years    Types: Cigarettes  . Smokeless tobacco: Never Used  . Alcohol Use: Yes     Comment: occasional beer    Review of Systems  Gastrointestinal: Positive for nausea and abdominal pain.       Melena   All other systems reviewed and are negative.     Allergies  Peanuts and Acetaminophen  Home Medications   Prior to Admission medications   Medication Sig Start Date End Date Taking? Authorizing Provider  albuterol (PROVENTIL HFA;VENTOLIN HFA) 108 (90 BASE) MCG/ACT inhaler Inhale 2 puffs into the lungs every 4 (four) hours as needed for wheezing or shortness of breath. 07/17/13   Toy CookeyMegan Docherty, MD  amLODipine-olmesartan (AZOR) 5-40 MG per tablet Take 1  tablet by mouth daily.    Historical Provider, MD  gabapentin (NEURONTIN) 300 MG capsule Take 1 capsule (300 mg total) by mouth at bedtime. 12/14/13   Suanne MarkerVikram R Penumalli, MD  multivitamin (ONE-A-DAY MEN'S) TABS Take 1 tablet by mouth daily.    Historical Provider, MD  nicotine (NICODERM CQ - DOSED IN MG/24 HOURS) 14 mg/24hr patch Place 1 patch (14 mg total) onto the skin daily. 11/19/13   Leroy SeaPrashant K Singh, MD  oxycodone (OXY-IR) 5 MG capsule Take 5 mg by mouth every 4 (four) hours as needed for pain.     Historical Provider, MD  valsartan (DIOVAN) 80 MG tablet Take 80 mg by mouth daily.    Historical Provider, MD   BP 125/87  Pulse 77  Temp(Src) 98.5 F (36.9 C)  Resp 16  Ht 5\' 9"  (1.753 m)  Wt 190 lb (86.183 kg)  BMI 28.05 kg/m2  SpO2 98% Physical Exam  Nursing note and vitals reviewed. Constitutional: He is oriented to person, place, and time. He appears well-developed and well-nourished.  HENT:  Head: Normocephalic.  Mouth/Throat: Oropharynx is clear and moist.  Eyes: Conjunctivae and EOM are normal. Pupils are equal, round, and reactive to light.  Neck: Normal range of motion. Neck supple.  Cardiovascular: Normal rate, regular rhythm and normal heart sounds.  Pulmonary/Chest: Effort normal. No respiratory distress. He has no wheezes. He has no rales.  Abdominal: Soft. Bowel sounds are normal.  Mild epigastric tenderness, no rebound.   Genitourinary:  No obvious hemorrhoids. + melena   Musculoskeletal: Normal range of motion. He exhibits no edema and no tenderness.  Neurological: He is alert and oriented to person, place, and time. No cranial nerve deficit. Coordination normal.  Skin: Skin is warm and dry.  Psychiatric: He has a normal mood and affect. His behavior is normal. Judgment and thought content normal.    ED Course  Procedures (including critical care time) Labs Review Labs Reviewed  CBC WITH DIFFERENTIAL - Abnormal; Notable for the following:    Neutrophils  Relative % 35 (*)    Eosinophils Relative 10 (*)    All other components within normal limits  COMPREHENSIVE METABOLIC PANEL - Abnormal; Notable for the following:    Glucose, Bld 104 (*)    GFR calc non Af Amer 87 (*)    All other components within normal limits  URINALYSIS, ROUTINE W REFLEX MICROSCOPIC  LIPASE, BLOOD  OCCULT BLOOD X 1 CARD TO LAB, STOOL    Imaging Review No results found.   EKG Interpretation None      MDM   Final diagnoses:  None   Draeden Kellman is a 48 y.o. male here with melena, epigastric pain. Consider gastritis vs gastric ulcer since he is taking narcotics. Will get orthostatics, labs, IVF.   4:26 PM Not orthostatic. Feels better. Hg 15, occ neg. I still think he has small gastric ulcer. Will start nexium, carafate. Recommend stop narcotics and no NSAIDs. Recommend GI f/u.    Richardean Canal, MD 01/21/14 [phone]

## 2014-02-24 ENCOUNTER — Encounter (INDEPENDENT_AMBULATORY_CARE_PROVIDER_SITE_OTHER): Payer: Self-pay

## 2014-02-24 ENCOUNTER — Ambulatory Visit (INDEPENDENT_AMBULATORY_CARE_PROVIDER_SITE_OTHER): Payer: 59 | Admitting: Diagnostic Neuroimaging

## 2014-02-24 ENCOUNTER — Encounter: Payer: Self-pay | Admitting: Diagnostic Neuroimaging

## 2014-02-24 DIAGNOSIS — R209 Unspecified disturbances of skin sensation: Secondary | ICD-10-CM

## 2014-02-24 DIAGNOSIS — M5416 Radiculopathy, lumbar region: Secondary | ICD-10-CM

## 2014-02-24 DIAGNOSIS — M545 Low back pain, unspecified: Secondary | ICD-10-CM

## 2014-02-24 DIAGNOSIS — R200 Anesthesia of skin: Secondary | ICD-10-CM

## 2014-02-24 DIAGNOSIS — Z0289 Encounter for other administrative examinations: Secondary | ICD-10-CM

## 2014-02-24 DIAGNOSIS — G8929 Other chronic pain: Secondary | ICD-10-CM

## 2014-02-24 NOTE — Procedures (Signed)
   [facility]  NCS (NERVE CONDUCTION STUDY) WITH EMG (ELECTROMYOGRAPHY) REPORT   STUDY DATE: 02/24/14  PATIENT NAME: Nijel Flink DOB: 09/10/1965 MRN: [id number]  ORDERING CLINICIAN: Joycelyn Schmid, MD   TECHNOLOGIST: Gearldine Shown  ELECTROMYOGRAPHER: Glenford Bayley. Penumalli, MD  CLINICAL INFORMATION: 48 year old male with left arm and left leg numbness / pain.  FINDINGS: NERVE CONDUCTION STUDY: Left median, left ulnar, bilateral peroneal, bilateral tibial motor responses and F wave latencies are normal. Left median, left ulnar, bilateral peroneal sensory responses are normal.  NEEDLE ELECTROMYOGRAPHY: Needle examination of left upper strayed deltoid, biceps, triceps, flexor carpi radialis, first dorsal interosseous and left C5-6 paraspinal muscles are normal.  IMPRESSION:  This is a normal study. No electrodiagnostic evidence of large fiber neuropathy or cervical radiculopathy at this time.   INTERPRETING PHYSICIAN:  Suanne Marker, MD Certified in Neurology, Neurophysiology and Neuroimaging  [facility] [address], [address] [place], [place] [address] [phone]

## 2014-04-19 ENCOUNTER — Emergency Department: Payer: Self-pay | Admitting: Emergency Medicine

## 2014-04-19 LAB — COMPREHENSIVE METABOLIC PANEL
ALBUMIN: 3.9 g/dL (ref 3.4–5.0)
ANION GAP: 6 — AB (ref 7–16)
AST: 24 U/L (ref 15–37)
Alkaline Phosphatase: 100 U/L
BUN: 13 mg/dL (ref 7–18)
Bilirubin,Total: 0.6 mg/dL (ref 0.2–1.0)
Calcium, Total: 8.7 mg/dL (ref 8.5–10.1)
Chloride: 107 mmol/L (ref 98–107)
Co2: 27 mmol/L (ref 21–32)
Creatinine: 0.83 mg/dL (ref 0.60–1.30)
Glucose: 92 mg/dL (ref 65–99)
Osmolality: 279 (ref 275–301)
Potassium: 4 mmol/L (ref 3.5–5.1)
SGPT (ALT): 42 U/L
SODIUM: 140 mmol/L (ref 136–145)
Total Protein: 7.8 g/dL (ref 6.4–8.2)

## 2014-04-19 LAB — CBC
HCT: 48.5 % (ref 40.0–52.0)
HGB: 16 g/dL (ref 13.0–18.0)
MCH: 31.4 pg (ref 26.0–34.0)
MCHC: 32.9 g/dL (ref 32.0–36.0)
MCV: 96 fL (ref 80–100)
PLATELETS: 278 10*3/uL (ref 150–440)
RBC: 5.08 10*6/uL (ref 4.40–5.90)
RDW: 13.8 % (ref 11.5–14.5)
WBC: 6.4 10*3/uL (ref 3.8–10.6)

## 2014-04-19 LAB — URIC ACID: URIC ACID: 5.4 mg/dL (ref 3.5–7.2)

## 2014-05-15 ENCOUNTER — Emergency Department: Payer: Self-pay | Admitting: Emergency Medicine

## 2014-09-06 ENCOUNTER — Emergency Department
Admit: 2014-09-06 | Disposition: A | Discharge status: Home / Self Care | Payer: Self-pay | Admitting: Emergency Medicine

## 2014-09-06 LAB — COMPREHENSIVE METABOLIC PANEL
ALK PHOS: 79 U/L
ALT: 31 U/L
Albumin: 4.3 g/dL
Anion Gap: 8 (ref 7–16)
BILIRUBIN TOTAL: 0.5 mg/dL
BUN: 16 mg/dL
CHLORIDE: 100 mmol/L — AB
CO2: 26 mmol/L
CREATININE: 0.95 mg/dL
Calcium, Total: 9 mg/dL
EGFR (African American): >60
EGFR (Non-African Amer.): >60
GLUCOSE: 110 mg/dL — AB
POTASSIUM: 3.6 mmol/L
SGOT(AST): 21 U/L
Sodium: 134 mmol/L — ABNORMAL LOW
Total Protein: 7.6 g/dL

## 2014-09-06 LAB — CBC WITH DIFFERENTIAL/PLATELET
BASOS ABS: 0 10*3/uL (ref 0.0–0.1)
Basophil %: 0.6 %
Eosinophil #: 0.3 10*3/uL (ref 0.0–0.7)
Eosinophil %: 5.4 %
HCT: 48.8 % (ref 40.0–52.0)
HGB: 16.2 g/dL (ref 13.0–18.0)
LYMPHS PCT: 37.1 %
Lymphocyte #: 2 10*3/uL (ref 1.0–3.6)
MCH: 31.5 pg (ref 26.0–34.0)
MCHC: 33.2 g/dL (ref 32.0–36.0)
MCV: 95 fL (ref 80–100)
Monocyte #: 0.8 x10 3/mm (ref 0.2–1.0)
Monocyte %: 14.7 %
NEUTROS ABS: 2.3 10*3/uL (ref 1.4–6.5)
Neutrophil %: 42.2 %
Platelet: 289 10*3/uL (ref 150–440)
RBC: 5.14 10*6/uL (ref 4.40–5.90)
RDW: 13.6 % (ref 11.5–14.5)
WBC: 5.3 10*3/uL (ref 3.8–10.6)

## 2014-09-06 LAB — TROPONIN I

## 2014-09-06 LAB — LIPASE, BLOOD: Lipase: 53 U/L — ABNORMAL HIGH

## 2015-03-20 ENCOUNTER — Encounter (HOSPITAL_COMMUNITY): Payer: Self-pay | Admitting: Emergency Medicine

## 2015-03-20 ENCOUNTER — Emergency Department (INDEPENDENT_AMBULATORY_CARE_PROVIDER_SITE_OTHER)
Admission: EM | Admit: 2015-03-20 | Discharge: 2015-03-20 | Disposition: A | Discharge status: Home / Self Care | Payer: Self-pay | Source: Home / Self Care | Attending: Emergency Medicine | Admitting: Emergency Medicine

## 2015-03-20 ENCOUNTER — Emergency Department (INDEPENDENT_AMBULATORY_CARE_PROVIDER_SITE_OTHER): Payer: Self-pay

## 2015-03-20 DIAGNOSIS — M25561 Pain in right knee: Secondary | ICD-10-CM

## 2015-03-20 DIAGNOSIS — M545 Low back pain, unspecified: Secondary | ICD-10-CM

## 2015-03-20 DIAGNOSIS — Z23 Encounter for immunization: Secondary | ICD-10-CM

## 2015-03-20 DIAGNOSIS — L200 Besnier's prurigo: Secondary | ICD-10-CM

## 2015-03-20 DIAGNOSIS — L239 Allergic contact dermatitis, unspecified cause: Secondary | ICD-10-CM

## 2015-03-20 MED ORDER — TETANUS-DIPHTH-ACELL PERTUSSIS 5-2.5-18.5 LF-MCG/0.5 IM SUSP
0.5000 mL | Freq: Once | INTRAMUSCULAR | Status: AC
  Administered 2015-03-20: 0.5 mL via INTRAMUSCULAR

## 2015-03-20 MED ORDER — CYCLOBENZAPRINE HCL 10 MG PO TABS: 10.0000 mg | ORAL_TABLET | Freq: Three times a day (TID) | ORAL | Status: DC | PRN

## 2015-03-20 MED ORDER — MELOXICAM 15 MG PO TABS: 15.0000 mg | ORAL_TABLET | Freq: Every day | ORAL | Status: DC

## 2015-03-20 MED ORDER — TETANUS-DIPHTH-ACELL PERTUSSIS 5-2.5-18.5 LF-MCG/0.5 IM SUSP
INTRAMUSCULAR | Status: AC
  Filled 2015-03-20: qty 0.5

## 2015-03-20 MED ORDER — HYDROCODONE-ACETAMINOPHEN 5-325 MG PO TABS: 1.0000 | ORAL_TABLET | Freq: Four times a day (QID) | ORAL | Status: DC | PRN
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
## 2015-03-20 NOTE — Discharge Instructions (Signed)
You have muscle spasm in your back that is causing your pain. Take Flexeril 3 times a day as needed for muscle spasms. This medicine will make you drowsy. Take meloxicam daily for the next week, then as needed for pain. Apply moist heat as often as you can. Use the Norco every 4-6 hours as needed for severe pain. Do not drive while taking this medicine.  Your x-ray of the knee is normal. Knee pain is from jarring of the knee. Put ice on the knee at least 3 times a day. Meloxicam will also help with your knee pain.  You should gradually improve over the next 1-2 weeks.  Please don't let your dog sleep on the bed anymore. Start taking your loratadine again. You can apply over-the-counter hydrocortisone cream to your face twice a day for no more than 4 days.
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
## 2015-04-27 ENCOUNTER — Emergency Department (INDEPENDENT_AMBULATORY_CARE_PROVIDER_SITE_OTHER)
Admission: EM | Admit: 2015-04-27 | Discharge: 2015-04-27 | Disposition: A | Discharge status: Home / Self Care | Payer: No Typology Code available for payment source | Source: Home / Self Care | Attending: Emergency Medicine | Admitting: Emergency Medicine

## 2015-04-27 ENCOUNTER — Encounter (HOSPITAL_COMMUNITY): Payer: Self-pay | Admitting: Emergency Medicine

## 2015-04-27 DIAGNOSIS — M549 Dorsalgia, unspecified: Secondary | ICD-10-CM | POA: Diagnosis not present

## 2015-04-27 DIAGNOSIS — R350 Frequency of micturition: Secondary | ICD-10-CM

## 2015-04-27 DIAGNOSIS — R21 Rash and other nonspecific skin eruption: Secondary | ICD-10-CM | POA: Diagnosis not present

## 2015-04-27 LAB — POCT I-STAT, CHEM 8
BUN: 13 mg/dL (ref 6–20)
CHLORIDE: 102 mmol/L (ref 101–111)
CREATININE: 0.9 mg/dL (ref 0.61–1.24)
Calcium, Ion: 1.21 mmol/L (ref 1.12–1.23)
GLUCOSE: 88 mg/dL (ref 65–99)
HCT: 49 % (ref 39.0–52.0)
Hemoglobin: 16.7 g/dL (ref 13.0–17.0)
POTASSIUM: 3.9 mmol/L (ref 3.5–5.1)
Sodium: 141 mmol/L (ref 135–145)
TCO2: 27 mmol/L (ref 0–100)

## 2015-04-27 LAB — POCT URINALYSIS DIP (DEVICE)
BILIRUBIN URINE: NEGATIVE
Glucose, UA: NEGATIVE mg/dL
HGB URINE DIPSTICK: NEGATIVE
KETONES UR: NEGATIVE mg/dL
Leukocytes, UA: NEGATIVE
NITRITE: NEGATIVE
PH: 7 (ref 5.0–8.0)
Protein, ur: NEGATIVE mg/dL
SPECIFIC GRAVITY, URINE: 1.02 (ref 1.005–1.030)
Urobilinogen, UA: 0.2 mg/dL (ref 0.0–1.0)

## 2015-04-27 MED ORDER — HYDROCODONE-ACETAMINOPHEN 5-325 MG PO TABS: 1.0000 | ORAL_TABLET | Freq: Four times a day (QID) | ORAL | Status: DC | PRN

## 2015-04-27 MED ORDER — IBUPROFEN 600 MG PO TABS: 600.0000 mg | ORAL_TABLET | Freq: Four times a day (QID) | ORAL | Status: DC | PRN

## 2015-04-27 MED ORDER — CLOTRIMAZOLE-BETAMETHASONE 1-0.05 % EX CREA: TOPICAL_CREAM | CUTANEOUS | Status: DC

## 2015-04-27 NOTE — ED Provider Notes (Signed)
CSN: [id number]     Arrival date & time 04/27/15  1407 History   First MD Initiated Contact with Patient 04/27/15 1534     Chief Complaint  Patient presents with  . Back Pain   (Consider location/radiation/quality/duration/timing/severity/associated sxs/prior Treatment) HPI He is a 49 year old man here for evaluation of back pain. He states for the last 3 days he has had a sharp pain in his right mid back, flank area. He also reports frequent urination, particularly at night. This is been going on for several weeks. The last few days, he has developed some dysuria. He states his urine is very dark colored. He denies any nausea or vomiting. No fevers or chills. He has taken some aspirin with some mild improvement of the pain.    He also reports about a week of an itchy rash on his right shoulder, right chest wall, and left buttock. He has been applying A and D ointment as he has a history of eczema with mild improvement.  Past Medical History  Diagnosis Date  . Hypertension   . Unspecified asthma(493.90)   . Hyperlipidemia   . Anxiety 12/30/2011  . High cholesterol   . DDD (degenerative disc disease)    Past Surgical History  Procedure Laterality Date  . Cholecystectomy    . Right leg surgury      pin in right leg  . Right foot surgury  apirl 2013    [facility]   Family History  Problem Relation Age of Onset  . Hypertension Father   . Diabetes Sister   . High blood pressure Sister   . High blood pressure Sister   . Stroke Neg Hx   . Heart attack Neg Hx    Social History  Substance Use Topics  . Smoking status: Current Every Day Smoker -- 1.00 packs/day for 32 years    Types: Cigarettes  . Smokeless tobacco: Never Used  . Alcohol Use: Yes     Comment: occasional beer    Review of Systems As in history of present illness Allergies  Peanuts and Acetaminophen  Home Medications   Prior to Admission medications   Medication Sig Start Date End Date Taking? Authorizing  Provider  albuterol (PROVENTIL HFA;VENTOLIN HFA) 108 (90 BASE) MCG/ACT inhaler Inhale 2 puffs into the lungs every 4 (four) hours as needed for wheezing or shortness of breath. 07/17/13   Toy CookeyMegan Docherty, MD  amLODipine-olmesartan (AZOR) 5-40 MG per tablet Take 1 tablet by mouth daily.    Historical Provider, MD  clotrimazole-betamethasone (LOTRISONE) cream Apply to affected area 2 times daily 04/27/15   Charm RingsErin J Radames Mejorado, MD  esomeprazole (NEXIUM) 40 MG capsule Take 1 capsule (40 mg total) by mouth daily. 01/21/14   Richardean Canalavid H Yao, MD  gabapentin (NEURONTIN) 300 MG capsule Take 1 capsule (300 mg total) by mouth at bedtime. 12/14/13   Suanne MarkerVikram R Penumalli, MD  HYDROcodone-acetaminophen (NORCO) 5-325 MG tablet Take 1 tablet by mouth every 6 (six) hours as needed for moderate pain. 04/27/15   Charm RingsErin J Brendin Situ, MD  ibuprofen (ADVIL,MOTRIN) 600 MG tablet Take 1 tablet (600 mg total) by mouth every 6 (six) hours as needed for moderate pain. 04/27/15   Charm RingsErin J Elivia Robotham, MD  multivitamin (ONE-A-DAY MEN'S) TABS Take 1 tablet by mouth daily.    Historical Provider, MD  nicotine (NICODERM CQ - DOSED IN MG/24 HOURS) 14 mg/24hr patch Place 1 patch (14 mg total) onto the skin daily. 11/19/13   Leroy SeaPrashant K Singh, MD  sucralfate (  CARAFATE) 1 G tablet Take 1 tablet (1 g total) by mouth 2 (two) times daily. 01/21/14   Richardean Canal, MD  valsartan (DIOVAN) 80 MG tablet Take 80 mg by mouth daily.    Historical Provider, MD   Meds Ordered and Administered this Visit  Medications - No data to display  BP 122/78 mmHg  Pulse 84  Temp(Src) 98.3 F (36.8 C) (Oral)  Resp 16  SpO2 97% No data found.   Physical Exam  Constitutional: He is oriented to person, place, and time. He appears well-developed and well-nourished. No distress.  Cardiovascular: Normal rate.   Pulmonary/Chest: Effort normal.  Abdominal: Soft. Bowel sounds are normal. He exhibits no distension and no mass. There is tenderness (he reports pain that radiates to his  epigastric region with palpation of the suprapubic area). There is no rebound and no guarding.  Musculoskeletal:  Back: No erythema or edema. He is extremely tender over the right flank.  Neurological: He is alert and oriented to person, place, and time.  Skin:  He has been ovoid patch of dry scaly skin on the right anterior shoulder. He has a similar patch just lateral to the right nipple. He has a larger plaque on the left buttock.    ED Course  Procedures (including critical care time)  Labs Review Labs Reviewed  POCT I-STAT, CHEM 8  POCT URINALYSIS DIP (DEVICE)    Imaging Review No results found.  MDM   1. Mid back pain on right side   2. Urinary frequency   3. Rash    Urine and i-STAT are completely normal.  This makes kidney stone unlikely. We'll treat back pain symptomatically with ibuprofen and Norco. Return precautions reviewed. Recommended no caffeine after 4 PM to see if that helps with nocturnal urinary frequency. Rash is most consistent with eczema versus tinea. Will treat with clotrimazole/betamethasone cream.    Charm Rings, MD 04/27/15 [phone]

## 2015-04-27 NOTE — Discharge Instructions (Signed)
Your back pain may be coming from the muscles and tendons in your back or possibly a very early kidney stone. Take ibuprofen 600 mg every 6 hours for pain. Use the Norco as needed for severe pain. Your urinary frequency may be due to having caffeine too close to bedtime. Please avoid all caffeine including coffee and sodas after 4 PM. Use the cream on the rash twice a day for the next week. If your pain gets worse, you start vomiting, you have a fever, or you see blood in your urine, please go to the emergency room. Follow-up with your PCP if you continue to have urinary frequency or the rash does not improve.

## 2015-04-27 NOTE — ED Notes (Signed)
Reports right lower back pain that is sharp and worsens at night.  Also concerned for frequent urination at night and has noticed urine is brown tinged.  Reports 3 day history

## 2015-11-08 ENCOUNTER — Emergency Department: Payer: BLUE CROSS/BLUE SHIELD

## 2015-11-08 ENCOUNTER — Encounter: Payer: Self-pay | Admitting: Emergency Medicine

## 2015-11-08 ENCOUNTER — Emergency Department
Admission: EM | Admit: 2015-11-08 | Discharge: 2015-11-08 | Disposition: A | Discharge status: Home / Self Care | Payer: BLUE CROSS/BLUE SHIELD | Attending: Emergency Medicine | Admitting: Emergency Medicine

## 2015-11-08 DIAGNOSIS — F17210 Nicotine dependence, cigarettes, uncomplicated: Secondary | ICD-10-CM | POA: Diagnosis not present

## 2015-11-08 DIAGNOSIS — E785 Hyperlipidemia, unspecified: Secondary | ICD-10-CM | POA: Insufficient documentation

## 2015-11-08 DIAGNOSIS — Z79899 Other long term (current) drug therapy: Secondary | ICD-10-CM | POA: Diagnosis not present

## 2015-11-08 DIAGNOSIS — J029 Acute pharyngitis, unspecified: Secondary | ICD-10-CM | POA: Diagnosis present

## 2015-11-08 DIAGNOSIS — J069 Acute upper respiratory infection, unspecified: Secondary | ICD-10-CM | POA: Diagnosis not present

## 2015-11-08 DIAGNOSIS — I10 Essential (primary) hypertension: Secondary | ICD-10-CM | POA: Diagnosis not present

## 2015-11-08 DIAGNOSIS — J45901 Unspecified asthma with (acute) exacerbation: Secondary | ICD-10-CM | POA: Diagnosis not present

## 2015-11-08 LAB — POCT RAPID STREP A: STREPTOCOCCUS, GROUP A SCREEN (DIRECT): NEGATIVE

## 2015-11-08 MED ORDER — IPRATROPIUM-ALBUTEROL 0.5-2.5 (3) MG/3ML IN SOLN
3.0000 mL | Freq: Once | RESPIRATORY_TRACT | Status: AC
  Administered 2015-11-08: 3 mL via RESPIRATORY_TRACT
  Filled 2015-11-08: qty 3

## 2015-11-08 MED ORDER — IPRATROPIUM-ALBUTEROL 0.5-2.5 (3) MG/3ML IN SOLN: 3.0000 mL | Freq: Four times a day (QID) | RESPIRATORY_TRACT | Status: DC | PRN

## 2015-11-08 MED ORDER — AZITHROMYCIN 250 MG PO TABS: ORAL_TABLET | ORAL | Status: DC

## 2015-11-08 MED ORDER — GUAIFENESIN-CODEINE 100-10 MG/5ML PO SOLN: 10.0000 mL | ORAL | Status: DC | PRN

## 2015-11-08 MED ORDER — CHLORPHENIRAMINE MALEATE 4 MG PO TABS: 4.0000 mg | ORAL_TABLET | Freq: Two times a day (BID) | ORAL | Status: DC | PRN

## 2015-11-08 NOTE — ED Provider Notes (Signed)
[facility] Emergency Department Provider Note  ____________________________________________  Time seen: Approximately 12:38 PM  I have reviewed the triage vital signs and the nursing notes.   HISTORY  Chief Complaint Sore Throat    HPI Daniel Valencia is a 50 y.o. male    Past Medical History  Diagnosis Date  . Hypertension   . Unspecified asthma(493.90)   . Hyperlipidemia   . Anxiety 12/30/2011  . High cholesterol   . DDD (degenerative disc disease)     Patient Active Problem List   Diagnosis Date Noted  . Left arm numbness 12/14/2013  . TIA (transient ischemic attack) 11/18/2013  . Ingrown nail 07/07/2013  . Pain, joint, foot, right 07/07/2013  . Bipolar affective disorder (HCC) 02/21/2012  . Allergic rhinitis, cause unspecified 02/21/2012  . Concentration deficit 02/21/2012  . Anxiety disorder 12/30/2011  . Chronic lower back pain 11/12/2011  . Hyperlipidemia   . Routine general medical examination at a health care facility 11/09/2011  . Unspecified asthma(493.90)   . Hypertension     Past Surgical History  Procedure Laterality Date  . Cholecystectomy    . Right leg surgury      pin in right leg  . Right foot surgury  apirl 2013    [facility]    Current Outpatient Rx  Name  Route  Sig  Dispense  Refill  . albuterol (PROVENTIL HFA;VENTOLIN HFA) 108 (90 BASE) MCG/ACT inhaler   Inhalation   Inhale 2 puffs into the lungs every 4 (four) hours as needed for wheezing or shortness of breath.   1 Inhaler   1   . amLODipine-olmesartan (AZOR) 5-40 MG per tablet   Oral   Take 1 tablet by mouth daily.         Marland Kitchen azithromycin (ZITHROMAX Z-PAK) 250 MG tablet      Take 2 tablets (500 mg) on  Day 1,  followed by 1 tablet (250 mg) once daily on Days 2 through 5.   6 each   0   . chlorpheniramine (CHLOR-TRIMETON) 4 MG tablet   Oral   Take 1 tablet (4 mg total) by mouth 2 (two) times daily as needed for allergies or rhinitis.   30  tablet   0   . clotrimazole-betamethasone (LOTRISONE) cream      Apply to affected area 2 times daily   45 g   0   . esomeprazole (NEXIUM) 40 MG capsule   Oral   Take 1 capsule (40 mg total) by mouth daily.   30 capsule   0   . guaiFENesin-codeine 100-10 MG/5ML syrup   Oral   Take 10 mLs by mouth every 4 (four) hours as needed for cough.   180 mL   0   . multivitamin (ONE-A-DAY MEN'S) TABS   Oral   Take 1 tablet by mouth daily.         . nicotine (NICODERM CQ - DOSED IN MG/24 HOURS) 14 mg/24hr patch   Transdermal   Place 1 patch (14 mg total) onto the skin daily.   28 patch   0   . sucralfate (CARAFATE) 1 G tablet   Oral   Take 1 tablet (1 g total) by mouth 2 (two) times daily.   30 tablet   0   . valsartan (DIOVAN) 80 MG tablet   Oral   Take 80 mg by mouth daily.           Allergies Peanuts and Acetaminophen  Family  History  Problem Relation Age of Onset  . Hypertension Father   . Diabetes Sister   . High blood pressure Sister   . High blood pressure Sister   . Stroke Neg Hx   . Heart attack Neg Hx     Social History Social History  Substance Use Topics  . Smoking status: Current Every Day Smoker -- 1.00 packs/day for 32 years    Types: Cigarettes  . Smokeless tobacco: Never Used  . Alcohol Use: Yes     Comment: occasional beer    Review of Systems Constitutional: No fever/chills Eyes: No visual changes. ENT: No sore throat. Cardiovascular: Denies chest pain. Respiratory: Denies shortness of breath. Gastrointestinal: No abdominal pain.  No nausea, no vomiting.  No diarrhea.  No constipation. Genitourinary: Negative for dysuria. Musculoskeletal: Negative for back pain. Skin: Negative for rash. Neurological: Negative for headaches, focal weakness or numbness.  10-point ROS otherwise negative.  ____________________________________________   PHYSICAL EXAM:  VITAL SIGNS: ED Triage Vitals  Enc Vitals Group     BP 11/08/15 1212  152/97 mmHg     Pulse Rate 11/08/15 1212 89     Resp --      Temp 11/08/15 1212 98.7 F (37.1 C)     Temp Source 11/08/15 1212 Oral     SpO2 11/08/15 1212 99 %     Weight 11/08/15 1212 216 lb (97.977 kg)     Height 11/08/15 1212  (1.753 m)     Head Cir --      Peak Flow --      Pain Score 11/08/15 1214 6     Pain Loc --      Pain Edu? --      Excl. in GC? --     Constitutional: Alert and oriented. Well appearing and in no acute distress. Eyes: Conjunctivae are normal. PERRL. EOMI. Head: Atraumatic. Nose: No congestion/rhinnorhea. Mouth/Throat: Mucous membranes are moist.  Oropharynx non-erythematous. Neck: No stridor.   Cardiovascular: Normal rate, regular rhythm. Grossly normal heart sounds.  Good peripheral circulation. Respiratory: Normal respiratory effort.  No retractions. Lungs CTAB. Gastrointestinal: Soft and nontender. No distention. No abdominal bruits. No CVA tenderness. Musculoskeletal: No lower extremity tenderness nor edema.  No joint effusions. Neurologic:  Normal speech and language. No gross focal neurologic deficits are appreciated. No gait instability. Skin:  Skin is warm, dry and intact. No rash noted. Psychiatric: Mood and affect are normal. Speech and behavior are normal.  ____________________________________________   LABS (all labs ordered are listed, but only abnormal results are displayed)  Labs Reviewed  CULTURE, GROUP A STREP [facility])  POCT RAPID STREP A   ____________________________________________  EKG   ____________________________________________  RADIOLOGY   ____________________________________________   PROCEDURES  Procedure(s) performed: None  Critical Care performed: No  ____________________________________________   INITIAL IMPRESSION / ASSESSMENT AND PLAN / ED COURSE  Pertinent labs & imaging results that were available during my care of the patient were reviewed by me and considered in my medical decision making  (see chart for details).  Acute exacerbation of asthma. Pharyngitis upper respiratory infection Rx given for Z-Pak, Robitussin-AC and chlorpheniramine. Patient follow-up with PCP or return to the ER with any worsening symptomology. ____________________________________________   FINAL CLINICAL IMPRESSION(S) / ED DIAGNOSES  Final diagnoses:  URI, acute  Acute pharyngitis, unspecified etiology  Asthma exacerbation     This chart was dictated using voice recognition software/Dragon. Despite best efforts to proofread, errors can occur which can change the meaning. Any  change was purely unintentional.   Evangeline Dakinharles M Beers, PA-C 11/08/15 1425  Jeanmarie PlantJames A McShane, MD 11/08/15 [phone]

## 2015-11-08 NOTE — Discharge Instructions (Signed)
Asthma, Acute Bronchospasm °Acute bronchospasm caused by asthma is also referred to as an asthma attack. Bronchospasm means your air passages become narrowed. The narrowing is caused by inflammation and tightening of the muscles in the air tubes (bronchi) in your lungs. This can make it hard to breathe or cause you to wheeze and cough. °CAUSES °Possible triggers are: °· Animal dander from the skin, hair, or feathers of animals. °· Dust mites contained in house dust. °· Cockroaches. °· Pollen from trees or grass. °· Mold. °· Cigarette or tobacco smoke. °· Air pollutants such as dust, household cleaners, hair sprays, aerosol sprays, paint fumes, strong chemicals, or strong odors. °· Cold air or weather changes. Cold air may trigger inflammation. Winds increase molds and pollens in the air. °· Strong emotions such as crying or laughing hard. °· Stress. °· Certain medicines such as aspirin or beta-blockers. °· Sulfites in foods and drinks, such as dried fruits and wine. °· Infections or inflammatory conditions, such as a flu, cold, or inflammation of the nasal membranes (rhinitis). °· Gastroesophageal reflux disease (GERD). GERD is a condition where stomach acid backs up into your esophagus. °· Exercise or strenuous activity. °SIGNS AND SYMPTOMS  °· Wheezing. °· Excessive coughing, particularly at night. °· Chest tightness. °· Shortness of breath. °DIAGNOSIS  °Your health care provider will ask you about your medical history and perform a physical exam. A chest X-ray or blood testing may be performed to look for other causes of your symptoms or other conditions that may have triggered your asthma attack.  °TREATMENT  °Treatment is aimed at reducing inflammation and opening up the airways in your lungs.  Most asthma attacks are treated with inhaled medicines. These include quick relief or rescue medicines (such as bronchodilators) and controller medicines (such as inhaled corticosteroids). These medicines are sometimes  given through an inhaler or a nebulizer. Systemic steroid medicine taken by mouth or given through an IV tube also can be used to reduce the inflammation when an attack is moderate or severe. Antibiotic medicines are only used if a bacterial infection is present.  °HOME CARE INSTRUCTIONS  °· Rest. °· Drink plenty of liquids. This helps the mucus to remain thin and be easily coughed up. Only use caffeine in moderation and do not use alcohol until you have recovered from your illness. °· Do not smoke. Avoid being exposed to secondhand smoke. °· You play a critical role in keeping yourself in good health. Avoid exposure to things that cause you to wheeze or to have breathing problems. °· Keep your medicines up-to-date and available. Carefully follow your health care provider's treatment plan. °· Take your medicine exactly as prescribed. °· When pollen or pollution is bad, keep windows closed and use an air conditioner or go to places with air conditioning. °· Asthma requires careful medical care. See your health care provider for a follow-up as advised. If you are more than [redacted] weeks pregnant and you were prescribed any new medicines, let your obstetrician know about the visit and how you are doing. Follow up with your health care provider as directed. °· After you have recovered from your asthma attack, make an appointment with your outpatient doctor to talk about ways to reduce the likelihood of future attacks. If you do not have a doctor who manages your asthma, make an appointment with a primary care doctor to discuss your asthma. °SEEK IMMEDIATE MEDICAL CARE IF:  °· You are getting worse. °· You have trouble breathing. If severe, call your local   emergency services (911 in the [place]  You develop chest pain or discomfort.  You are vomiting.  You are not able to keep fluids down.  You are coughing up yellow, green, brown, or bloody sputum.  You have a fever and your symptoms suddenly get worse.  You have  trouble swallowing. MAKE SURE YOU:   Understand these instructions.  Will watch your condition.  Will get help right away if you are not doing well or get worse.   This information is not intended to replace advice given to you by your health care provider. Make sure you discuss any questions you have with your health care provider.   Document Released: 09/05/2006 Document Revised: 05/26/2013 Document Reviewed: 11/26/2012 Elsevier Interactive Patient Education 2016 Else[employer] Inc[employer]  Sore Throat A sore throat is pain, burning, irritation, or scratchiness of the throat. There is often pain or tenderness when swallowing or talking. A sore throat may be accompanied by other symptoms, such as coughing, sneezing, fever, and swollen neck glands. A sore throat is often the first sign of another sickness, such as a cold, flu, strep throat, or mononucleosis (commonly known as mono). Most sore throats go away without medical treatment. CAUSES  The most common causes of a sore throat include:  A viral infection, such as a cold, flu, or mono.  A bacterial infection, such as strep throat, tonsillitis, or whooping cough.  Seasonal allergies.  Dryness in the air.  Irritants, such as smoke or pollution.  Gastroesophageal reflux disease (GERD). HOME CARE INSTRUCTIONS   Only take over-the-counter medicines as directed by your caregiver.  Drink enough fluids to keep your urine clear or pale yellow.  Rest as needed.  Try using throat sprays, lozenges, or sucking on hard candy to ease any pain (if older than 4 years or as directed).  Sip warm liquids, such as broth, herbal tea, or warm water with honey to relieve pain temporarily. You may also eat or drink cold or frozen liquids such as frozen ice pops.  Gargle with salt water (mix 1 tsp salt with 8 oz of water).  Do not smoke and avoid secondhand smoke.  Put a cool-mist humidifier in your bedroom at night to moisten the air. You can also turn  on a hot shower and sit in the bathroom with the door closed for 5-10 minutes. SEEK IMMEDIATE MEDICAL CARE IF:  You have difficulty breathing.  You are unable to swallow fluids, soft foods, or your saliva.  You have increased swelling in the throat.  Your sore throat does not get better in 7 days.  You have nausea and vomiting.  You have a fever or persistent symptoms for more than 2-3 days.  You have a fever and your symptoms suddenly get worse. MAKE SURE YOU:   Understand these instructions.  Will watch your condition.  Will get help right away if you are not doing well or get worse.   This information is not intended to replace advice given to you by your health care provider. Make sure you discuss any questions you have with your health care provider.   Document Released: 06/28/2004 Document Revised: 06/11/2014 Document Reviewed: 01/27/2012 Elsevier Interactive Patient Education 2016 Else[employer] Inc.  Upper Respiratory Infection, Adult Most upper respiratory infections (URIs) are a viral infection of the air passages leading to the lungs. A URI affects the nose, throat, and upper air passages. The most common type of URI is nasopharyngitis and is typically referred to as "the common cold."  URIs run their course and usually go away on their own. Most of the time, a URI does not require medical attention, but sometimes a bacterial infection in the upper airways can follow a viral infection. This is called a secondary infection. Sinus and middle ear infections are common types of secondary upper respiratory infections. Bacterial pneumonia can also complicate a URI. A URI can worsen asthma and chronic obstructive pulmonary disease (COPD). Sometimes, these complications can require emergency medical care and may be life threatening.  CAUSES Almost all URIs are caused by viruses. A virus is a type of germ and can spread from one person to another.  RISKS FACTORS You may be at risk for  a URI if:   You smoke.   You have chronic heart or lung disease.  You have a weakened defense (immune) system.   You are very young or very old.   You have nasal allergies or asthma.  You work in crowded or poorly ventilated areas.  You work in health care facilities or schools. SIGNS AND SYMPTOMS  Symptoms typically develop 2-3 days after you come in contact with a cold virus. Most viral URIs last 7-10 days. However, viral URIs from the influenza virus (flu virus) can last 14-18 days and are typically more severe. Symptoms may include:   Runny or stuffy (congested) nose.   Sneezing.   Cough.   Sore throat.   Headache.   Fatigue.   Fever.   Loss of appetite.   Pain in your forehead, behind your eyes, and over your cheekbones (sinus pain).  Muscle aches.  DIAGNOSIS  Your health care provider may diagnose a URI by:  Physical exam.  Tests to check that your symptoms are not due to another condition such as:  Strep throat.  Sinusitis.  Pneumonia.  Asthma. TREATMENT  A URI goes away on its own with time. It cannot be cured with medicines, but medicines may be prescribed or recommended to relieve symptoms. Medicines may help:  Reduce your fever.  Reduce your cough.  Relieve nasal congestion. HOME CARE INSTRUCTIONS   Take medicines only as directed by your health care provider.   Gargle warm saltwater or take cough drops to comfort your throat as directed by your health care provider.  Use a warm mist humidifier or inhale steam from a shower to increase air moisture. This may make it easier to breathe.  Drink enough fluid to keep your urine clear or pale yellow.   Eat soups and other clear broths and maintain good nutrition.   Rest as needed.   Return to work when your temperature has returned to normal or as your health care provider advises. You may need to stay home longer to avoid infecting others. You can also use a face mask and  careful hand washing to prevent spread of the virus.  Increase the usage of your inhaler if you have asthma.   Do not use any tobacco products, including cigarettes, chewing tobacco, or electronic cigarettes. If you need help quitting, ask your health care provider. PREVENTION  The best way to protect yourself from getting a cold is to practice good hygiene.   Avoid oral or hand contact with people with cold symptoms.   Wash your hands often if contact occurs.  There is no clear evidence that vitamin C, vitamin E, echinacea, or exercise reduces the chance of developing a cold. However, it is always recommended to get plenty of rest, exercise, and practice good nutrition.  SEEK MEDICAL CARE IF:   You are getting worse rather than better.   Your symptoms are not controlled by medicine.   You have chills.  You have worsening shortness of breath.  You have brown or red mucus.  You have yellow or brown nasal discharge.  You have pain in your face, especially when you bend forward.  You have a fever.  You have swollen neck glands.  You have pain while swallowing.  You have white areas in the back of your throat. SEEK IMMEDIATE MEDICAL CARE IF:   You have severe or persistent:  Headache.  Ear pain.  Sinus pain.  Chest pain.  You have chronic lung disease and any of the following:  Wheezing.  Prolonged cough.  Coughing up blood.  A change in your usual mucus.  You have a stiff neck.  You have changes in your:  Vision.  Hearing.  Thinking.  Mood. MAKE SURE YOU:   Understand these instructions.  Will watch your condition.  Will get help right away if you are not doing well or get worse.   This information is not intended to replace advice given to you by your health care provider. Make sure you discuss any questions you have with your health care provider.   Document Released: 11/14/2000 Document Revised: 10/05/2014 Document Reviewed:  08/26/2013 Elsevier Interactive Patient Education [employer]2016 Elsevier Inc.

## 2015-11-08 NOTE — ED Notes (Signed)
C/o sore throat and cough.  NAD.  Mask applied.

## 2015-11-11 LAB — CULTURE, GROUP A STREP (THRC)

## 2016-01-04 ENCOUNTER — Emergency Department
Admission: EM | Admit: 2016-01-04 | Discharge: 2016-01-04 | Disposition: A | Discharge status: Home / Self Care | Payer: BLUE CROSS/BLUE SHIELD | Attending: Emergency Medicine | Admitting: Emergency Medicine

## 2016-01-04 ENCOUNTER — Encounter: Payer: Self-pay | Admitting: Emergency Medicine

## 2016-01-04 ENCOUNTER — Emergency Department: Payer: BLUE CROSS/BLUE SHIELD

## 2016-01-04 DIAGNOSIS — G8929 Other chronic pain: Secondary | ICD-10-CM | POA: Diagnosis not present

## 2016-01-04 DIAGNOSIS — Y999 Unspecified external cause status: Secondary | ICD-10-CM | POA: Diagnosis not present

## 2016-01-04 DIAGNOSIS — F17210 Nicotine dependence, cigarettes, uncomplicated: Secondary | ICD-10-CM | POA: Insufficient documentation

## 2016-01-04 DIAGNOSIS — J45909 Unspecified asthma, uncomplicated: Secondary | ICD-10-CM | POA: Insufficient documentation

## 2016-01-04 DIAGNOSIS — M545 Low back pain: Secondary | ICD-10-CM | POA: Diagnosis not present

## 2016-01-04 DIAGNOSIS — Y939 Activity, unspecified: Secondary | ICD-10-CM | POA: Diagnosis not present

## 2016-01-04 DIAGNOSIS — M25561 Pain in right knee: Secondary | ICD-10-CM | POA: Diagnosis present

## 2016-01-04 DIAGNOSIS — I10 Essential (primary) hypertension: Secondary | ICD-10-CM | POA: Insufficient documentation

## 2016-01-04 DIAGNOSIS — Y92410 Unspecified street and highway as the place of occurrence of the external cause: Secondary | ICD-10-CM | POA: Diagnosis not present

## 2016-01-04 DIAGNOSIS — S8001XA Contusion of right knee, initial encounter: Secondary | ICD-10-CM

## 2016-01-04 MED ORDER — TRAMADOL HCL 50 MG PO TABS
50.0000 mg | ORAL_TABLET | Freq: Once | ORAL | Status: AC
  Administered 2016-01-04: 50 mg via ORAL
  Filled 2016-01-04: qty 1

## 2016-01-04 MED ORDER — NAPROXEN 500 MG PO TABS: 500.0000 mg | ORAL_TABLET | Freq: Two times a day (BID) | ORAL | 0 refills | Status: DC

## 2016-01-04 MED ORDER — NAPROXEN 500 MG PO TABS
500.0000 mg | ORAL_TABLET | Freq: Once | ORAL | Status: AC
  Administered 2016-01-04: 500 mg via ORAL
  Filled 2016-01-04: qty 1

## 2016-01-04 MED ORDER — TRAMADOL HCL 50 MG PO TABS: 50.0000 mg | ORAL_TABLET | Freq: Four times a day (QID) | ORAL | 0 refills | Status: DC | PRN
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
## 2016-05-07 DIAGNOSIS — J069 Acute upper respiratory infection, unspecified: Secondary | ICD-10-CM | POA: Insufficient documentation

## 2016-05-07 DIAGNOSIS — F17210 Nicotine dependence, cigarettes, uncomplicated: Secondary | ICD-10-CM | POA: Insufficient documentation

## 2016-05-07 DIAGNOSIS — I10 Essential (primary) hypertension: Secondary | ICD-10-CM | POA: Insufficient documentation

## 2016-05-07 DIAGNOSIS — Z79899 Other long term (current) drug therapy: Secondary | ICD-10-CM | POA: Insufficient documentation

## 2016-05-07 NOTE — ED Triage Notes (Signed)
Patient reports that he was at his Drs office 2 -3 days ago for similar symptoms. Patient reports that his heart rate is still high and he feels like he is dehydrated. He took his inhaler prior to coming. No distress noted.

## 2016-05-08 ENCOUNTER — Emergency Department (HOSPITAL_BASED_OUTPATIENT_CLINIC_OR_DEPARTMENT_OTHER): Payer: BLUE CROSS/BLUE SHIELD

## 2016-05-08 ENCOUNTER — Emergency Department (HOSPITAL_BASED_OUTPATIENT_CLINIC_OR_DEPARTMENT_OTHER)
Admission: EM | Admit: 2016-05-08 | Discharge: 2016-05-08 | Disposition: A | Discharge status: Home / Self Care | Payer: BLUE CROSS/BLUE SHIELD | Attending: Emergency Medicine | Admitting: Emergency Medicine

## 2016-05-08 ENCOUNTER — Encounter (HOSPITAL_BASED_OUTPATIENT_CLINIC_OR_DEPARTMENT_OTHER): Payer: Self-pay | Admitting: Emergency Medicine

## 2016-05-08 DIAGNOSIS — J069 Acute upper respiratory infection, unspecified: Secondary | ICD-10-CM

## 2016-05-08 DIAGNOSIS — B9789 Other viral agents as the cause of diseases classified elsewhere: Secondary | ICD-10-CM

## 2016-05-08 LAB — RAPID STREP SCREEN (MED CTR MEBANE ONLY): STREPTOCOCCUS, GROUP A SCREEN (DIRECT): NEGATIVE

## 2016-05-08 MED ORDER — ALBUTEROL SULFATE (2.5 MG/3ML) 0.083% IN NEBU
2.5000 mg | INHALATION_SOLUTION | Freq: Once | RESPIRATORY_TRACT | Status: AC
  Administered 2016-05-08: 2.5 mg via RESPIRATORY_TRACT
  Filled 2016-05-08: qty 3

## 2016-05-08 MED ORDER — NAPROXEN 250 MG PO TABS
500.0000 mg | ORAL_TABLET | Freq: Once | ORAL | Status: AC
  Administered 2016-05-08: 500 mg via ORAL
  Filled 2016-05-08: qty 2

## 2016-05-08 MED ORDER — IPRATROPIUM-ALBUTEROL 0.5-2.5 (3) MG/3ML IN SOLN: 3.0000 mL | Freq: Once | RESPIRATORY_TRACT | Status: DC

## 2016-05-08 MED ORDER — ALBUTEROL SULFATE HFA 108 (90 BASE) MCG/ACT IN AERS: 2.0000 | INHALATION_SPRAY | RESPIRATORY_TRACT | 0 refills | Status: DC | PRN

## 2016-05-08 MED ORDER — ALBUTEROL SULFATE (2.5 MG/3ML) 0.083% IN NEBU
5.0000 mg | INHALATION_SOLUTION | Freq: Once | RESPIRATORY_TRACT | Status: AC
  Administered 2016-05-08: 5 mg via RESPIRATORY_TRACT
  Filled 2016-05-08: qty 6

## 2016-05-08 MED ORDER — NAPROXEN 500 MG PO TABS: 500.0000 mg | ORAL_TABLET | Freq: Two times a day (BID) | ORAL | 0 refills | Status: DC

## 2016-05-08 MED ORDER — ALBUTEROL SULFATE HFA 108 (90 BASE) MCG/ACT IN AERS
2.0000 | INHALATION_SPRAY | Freq: Once | RESPIRATORY_TRACT | Status: AC
  Administered 2016-05-08: 2 via RESPIRATORY_TRACT
  Filled 2016-05-08: qty 6.7

## 2016-05-08 MED ORDER — ALBUTEROL SULFATE (2.5 MG/3ML) 0.083% IN NEBU: 2.5000 mg | INHALATION_SOLUTION | Freq: Once | RESPIRATORY_TRACT | Status: DC

## 2016-05-08 NOTE — ED Notes (Signed)
Pt tolerating PO fluids without issue. 

## 2016-05-08 NOTE — ED Notes (Signed)
Pt verbalizes understanding of d/c instructions and denies any further needs at this time. 

## 2016-05-08 NOTE — ED Provider Notes (Signed)
[facility] Note   CSN: [id number] Arrival date & time: 05/07/16  2346   By signing my name below, I, Avnee Patel, attest that this documentation has been prepared under the direction and in the presence of Shon Baton, MD  Electronically Signed: Clovis Pu, ED Scribe. 05/08/16. 1:02 AM.   History   Chief Complaint Chief Complaint  Patient presents with  . Cough   The history is provided by the patient. No language interpreter was used.   HPI Comments:  Daniel Valencia is a 50 y.o. male, with a hx of HTN and hyperlipidemia, who presents to the Emergency Department complaining of sudden onset, persistent dry cough  x several days. Pt notes associated sore throat, rhinorrhea and generalized muscle spasms. He notes he has been experiencing asthma exacerbation due to his symptoms. His symptoms are worse at night. Pt visited his PCP on 05/05/16 and was given a lidocaine gargle and a prednisone shot with no relief. Pt states he has been drinking fluids to stay hydrated. He is on losartan, amlodipine and valsartan for HTN. Pt denies fevers, vomiting, diarrhea, any other associated symptoms and modifying factors at this time. He is a smoker.    Past Medical History:  Diagnosis Date  . Anxiety 12/30/2011  . DDD (degenerative disc disease)   . High cholesterol   . Hyperlipidemia   . Hypertension   . Unspecified asthma(493.90)     Patient Active Problem List   Diagnosis Date Noted  . Left arm numbness 12/14/2013  . TIA (transient ischemic attack) 11/18/2013  . Ingrown nail 07/07/2013  . Pain, joint, foot, right 07/07/2013  . Bipolar affective disorder (HCC) 02/21/2012  . Allergic rhinitis, cause unspecified 02/21/2012  . Concentration deficit 02/21/2012  . Anxiety disorder 12/30/2011  . Chronic lower back pain 11/12/2011  . Hyperlipidemia   . Routine general medical examination at a health care facility 11/09/2011  . Unspecified asthma(493.90)   .  Hypertension     Past Surgical History:  Procedure Laterality Date  . CHOLECYSTECTOMY    . right foot surgury  apirl 2013   [facility]  . right leg surgury     pin in right leg    Home Medications    Prior to Admission medications   Medication Sig Start Date End Date Taking? Authorizing Provider  albuterol (PROVENTIL HFA;VENTOLIN HFA) 108 (90 Base) MCG/ACT inhaler Inhale 2 puffs into the lungs every 4 (four) hours as needed for wheezing or shortness of breath. 05/08/16   Shon Baton, MD  amLODipine-olmesartan (AZOR) 5-40 MG per tablet Take 1 tablet by mouth daily.    Historical Provider, MD  azithromycin (ZITHROMAX Z-PAK) 250 MG tablet Take 2 tablets (500 mg) on  Day 1,  followed by 1 tablet (250 mg) once daily on Days 2 through 5. 11/08/15   Evangeline Dakin, PA-C  chlorpheniramine (CHLOR-TRIMETON) 4 MG tablet Take 1 tablet (4 mg total) by mouth 2 (two) times daily as needed for allergies or rhinitis. 11/08/15   Evangeline Dakin, PA-C  clotrimazole-betamethasone (LOTRISONE) cream Apply to affected area 2 times daily 04/27/15   Charm Rings, MD  esomeprazole (NEXIUM) 40 MG capsule Take 1 capsule (40 mg total) by mouth daily. 01/21/14   Charlynne Pander, MD  guaiFENesin-codeine 100-10 MG/5ML syrup Take 10 mLs by mouth every 4 (four) hours as needed for cough. 11/08/15   Charmayne Sheer Beers, PA-C  ipratropium-albuterol (DUONEB) 0.5-2.5 (3) MG/3ML SOLN Take 3 mLs by  nebulization every 6 (six) hours as needed. 11/08/15   Charmayne Sheerharles M Beers, PA-C  multivitamin (ONE-A-DAY MEN'S) TABS Take 1 tablet by mouth daily.    Historical Provider, MD  naproxen (NAPROSYN) 500 MG tablet Take 1 tablet (500 mg total) by mouth 2 (two) times daily. 05/08/16   Shon Batonourtney F Plumer Mittelstaedt, MD  nicotine (NICODERM CQ - DOSED IN MG/24 HOURS) 14 mg/24hr patch Place 1 patch (14 mg total) onto the skin daily. 11/19/13   Leroy SeaPrashant K Singh, MD  sucralfate (CARAFATE) 1 G tablet Take 1 tablet (1 g total) by mouth 2 (two) times daily. 01/21/14    Charlynne Panderavid Hsienta Yao, MD  traMADol (ULTRAM) 50 MG tablet Take 1 tablet (50 mg total) by mouth every 6 (six) hours as needed for moderate pain. 01/04/16   Joni Reiningonald K Smith, PA-C  valsartan (DIOVAN) 80 MG tablet Take 80 mg by mouth daily.    Historical Provider, MD    Family History Family History  Problem Relation Age of Onset  . Hypertension Father   . Diabetes Sister   . High blood pressure Sister   . High blood pressure Sister   . Stroke Neg Hx   . Heart attack Neg Hx     Social History Social History  Substance Use Topics  . Smoking status: Current Every Day Smoker    Packs/day: 1.00    Years: 32.00    Types: Cigarettes  . Smokeless tobacco: Never Used  . Alcohol use Yes     Comment: occasional beer     Allergies   Peanuts [peanut oil] and Acetaminophen   Review of Systems Review of Systems  Constitutional: Negative for fever.  HENT: Positive for rhinorrhea and sore throat.   Respiratory: Positive for cough.   Gastrointestinal: Negative for diarrhea and vomiting.  Musculoskeletal: Positive for myalgias.  All other systems reviewed and are negative.    Physical Exam Updated Vital Signs BP 153/98 (BP Location: Right Arm)   Pulse 96   Temp 98.2 F (36.8 C) (Oral)   Resp 26   Ht 5\' 9"  (1.753 m)   Wt 214 lb (97.1 kg)   SpO2 98%   BMI 31.60 kg/m   Physical Exam  Constitutional: He is oriented to person, place, and time. He appears well-developed and well-nourished. No distress.  HENT:  Head: Normocephalic and atraumatic.  Mild posterior oropharyngeal erythema, uvula midline, no tonsillar swelling  Eyes: Pupils are equal, round, and reactive to light.  Neck: Neck supple.  Cardiovascular: Normal rate, regular rhythm and normal heart sounds.   No murmur heard. Pulmonary/Chest: Effort normal. No respiratory distress. He has wheezes.  Fair air movement, scant expiratory wheezing diffusely  Abdominal: Soft. Bowel sounds are normal. There is no tenderness. There is  no rebound.  Musculoskeletal: He exhibits no edema.  Lymphadenopathy:    He has no cervical adenopathy.  Neurological: He is alert and oriented to person, place, and time.  Skin: Skin is warm and dry.  Psychiatric: He has a normal mood and affect.  Nursing note and vitals reviewed.    ED Treatments / Results  DIAGNOSTIC STUDIES:  Oxygen Saturation is 99% on RA, normal by my interpretation.    COORDINATION OF CARE:  12:51 AM Discussed treatment plan with pt at bedside and pt agreed to plan.  Labs (all labs ordered are listed, but only abnormal results are displayed) Labs Reviewed  RAPID STREP SCREEN (NOT AT [facility])  CULTURE, GROUP A STREP [facility])    EKG  EKG  Interpretation None       Radiology Dg Chest 2 View  Result Date: 05/08/2016 CLINICAL DATA:  Cough and upper thoracic pain for 3 days. EXAM: CHEST  2 VIEW COMPARISON:  11/08/2015 FINDINGS: The heart size and mediastinal contours are within normal limits. Both lungs are clear. The visualized skeletal structures are unremarkable. IMPRESSION: No active cardiopulmonary disease. Electronically Signed   By: Ellery Plunkaniel R Mitchell M.D.   On: 05/08/2016 00:35    Procedures Procedures (including critical care time)  Medications Ordered in ED Medications  albuterol (PROVENTIL) (2.5 MG/3ML) 0.083% nebulizer solution 5 mg (5 mg Nebulization Given 05/08/16 0015)  albuterol (PROVENTIL) (2.5 MG/3ML) 0.083% nebulizer solution 2.5 mg (2.5 mg Nebulization Given 05/08/16 0030)  albuterol (PROVENTIL HFA;VENTOLIN HFA) 108 (90 Base) MCG/ACT inhaler 2 puff (2 puffs Inhalation Given 05/08/16 0114)  naproxen (NAPROSYN) tablet 500 mg (500 mg Oral Given 05/08/16 0110)     Initial Impression / Assessment and Plan / ED Course  I have reviewed the triage vital signs and the nursing notes.  Pertinent labs & imaging results that were available during my care of the patient were reviewed by me and considered in my medical decision making (see chart for  details).  Clinical Course     Patient presents with upper respiratory symptoms including sore throat and cough. He is a current smoker. Has some wheezing on exam but good air movement. He is status post DuoNeb and reports improvement of symptoms. Strep screen and chest x-ray reassuring. Suspect viral etiology. Will discharge home with an inhaler and additional supportive measures at home.  After history, exam, and medical workup I feel the patient has been appropriately medically screened and is safe for discharge home. Pertinent diagnoses were discussed with the patient. Patient was given return precautions.   Final Clinical Impressions(s) / ED Diagnoses   Final diagnoses:  Viral URI with cough    New Prescriptions Discharge Medication List as of 05/08/2016  1:45 AM     I personally performed the services described in this documentation, which was scribed in my presence. The recorded information has been reviewed and is accurate.     Shon Batonourtney F Jerri Hargadon, MD 05/08/16 [phone]

## 2016-05-08 NOTE — ED Notes (Signed)
ED Provider at bedside. 

## 2016-05-10 ENCOUNTER — Emergency Department (HOSPITAL_BASED_OUTPATIENT_CLINIC_OR_DEPARTMENT_OTHER)
Admission: EM | Admit: 2016-05-10 | Discharge: 2016-05-10 | Discharge status: Against Medical Advice | Payer: BLUE CROSS/BLUE SHIELD | Attending: Dermatology | Admitting: Dermatology

## 2016-05-10 ENCOUNTER — Encounter (HOSPITAL_BASED_OUTPATIENT_CLINIC_OR_DEPARTMENT_OTHER): Payer: Self-pay

## 2016-05-10 DIAGNOSIS — F17210 Nicotine dependence, cigarettes, uncomplicated: Secondary | ICD-10-CM | POA: Insufficient documentation

## 2016-05-10 DIAGNOSIS — Z79899 Other long term (current) drug therapy: Secondary | ICD-10-CM | POA: Insufficient documentation

## 2016-05-10 DIAGNOSIS — Z5321 Procedure and treatment not carried out due to patient leaving prior to being seen by health care provider: Secondary | ICD-10-CM | POA: Insufficient documentation

## 2016-05-10 DIAGNOSIS — I10 Essential (primary) hypertension: Secondary | ICD-10-CM | POA: Insufficient documentation

## 2016-05-10 LAB — CULTURE, GROUP A STREP (THRC)

## 2016-05-10 NOTE — ED Triage Notes (Signed)
Pt called for tx area-no answer-reg clerk states pt left

## 2016-05-10 NOTE — ED Triage Notes (Signed)
C/o abd cramps, chills, sweats x 3-4 days-seen here for sore throat 2 days ago-NAD-steady gait

## 2016-05-10 NOTE — ED Triage Notes (Signed)
No answer when called for tx area 

## 2016-08-15 ENCOUNTER — Encounter (HOSPITAL_BASED_OUTPATIENT_CLINIC_OR_DEPARTMENT_OTHER): Payer: Self-pay | Admitting: *Deleted

## 2016-08-15 ENCOUNTER — Emergency Department (HOSPITAL_BASED_OUTPATIENT_CLINIC_OR_DEPARTMENT_OTHER)
Admission: EM | Admit: 2016-08-15 | Discharge: 2016-08-15 | Disposition: A | Discharge status: Home / Self Care | Payer: BLUE CROSS/BLUE SHIELD | Attending: Emergency Medicine | Admitting: Emergency Medicine

## 2016-08-15 DIAGNOSIS — I491 Atrial premature depolarization: Secondary | ICD-10-CM

## 2016-08-15 DIAGNOSIS — F17210 Nicotine dependence, cigarettes, uncomplicated: Secondary | ICD-10-CM | POA: Insufficient documentation

## 2016-08-15 DIAGNOSIS — R002 Palpitations: Secondary | ICD-10-CM

## 2016-08-15 DIAGNOSIS — I10 Essential (primary) hypertension: Secondary | ICD-10-CM | POA: Insufficient documentation

## 2016-08-15 DIAGNOSIS — R8299 Other abnormal findings in urine: Secondary | ICD-10-CM | POA: Insufficient documentation

## 2016-08-15 DIAGNOSIS — Z79899 Other long term (current) drug therapy: Secondary | ICD-10-CM | POA: Insufficient documentation

## 2016-08-15 LAB — COMPREHENSIVE METABOLIC PANEL
ALK PHOS: 80 U/L (ref 38–126)
ALT: 25 U/L (ref 17–63)
ANION GAP: 7 (ref 5–15)
AST: 20 U/L (ref 15–41)
Albumin: 3.9 g/dL (ref 3.5–5.0)
BILIRUBIN TOTAL: 0.3 mg/dL (ref 0.3–1.2)
BUN: 16 mg/dL (ref 6–20)
CALCIUM: 8.8 mg/dL — AB (ref 8.9–10.3)
CO2: 24 mmol/L (ref 22–32)
Chloride: 104 mmol/L (ref 101–111)
Creatinine, Ser: 0.78 mg/dL (ref 0.61–1.24)
GFR calc Af Amer: >60 mL/min (ref >60)
GFR calc non Af Amer: >60 mL/min (ref >60)
Glucose, Bld: 100 mg/dL — ABNORMAL HIGH (ref 65–99)
Potassium: 3.6 mmol/L (ref 3.5–5.1)
SODIUM: 135 mmol/L (ref 135–145)
TOTAL PROTEIN: 7.2 g/dL (ref 6.5–8.1)

## 2016-08-15 LAB — D-DIMER, QUANTITATIVE (NOT AT ARMC)

## 2016-08-15 LAB — CBC WITH DIFFERENTIAL/PLATELET
Basophils Absolute: 0 10*3/uL (ref 0.0–0.1)
Basophils Relative: 1 %
EOS ABS: 0.5 10*3/uL (ref 0.0–0.7)
Eosinophils Relative: 10 %
HCT: 39.6 % (ref 39.0–52.0)
HEMOGLOBIN: 14.1 g/dL (ref 13.0–17.0)
LYMPHS ABS: 2.6 10*3/uL (ref 0.7–4.0)
Lymphocytes Relative: 45 %
MCH: 32.6 pg (ref 26.0–34.0)
MCHC: 35.6 g/dL (ref 30.0–36.0)
MCV: 91.7 fL (ref 78.0–100.0)
MONOS PCT: 12 %
Monocytes Absolute: 0.7 10*3/uL (ref 0.1–1.0)
NEUTROS PCT: 32 %
Neutro Abs: 1.8 10*3/uL (ref 1.7–7.7)
Platelets: 295 10*3/uL (ref 150–400)
RBC: 4.32 MIL/uL (ref 4.22–5.81)
RDW: 13.6 % (ref 11.5–15.5)
WBC: 5.7 10*3/uL (ref 4.0–10.5)

## 2016-08-15 LAB — MAGNESIUM: MAGNESIUM: 2.1 mg/dL (ref 1.7–2.4)

## 2016-08-15 LAB — CK: CK TOTAL: 178 U/L (ref 49–397)

## 2016-08-15 MED ORDER — SODIUM CHLORIDE 0.9 % IV BOLUS (SEPSIS)
1000.0000 mL | Freq: Once | INTRAVENOUS | Status: AC
  Administered 2016-08-15: 1000 mL via INTRAVENOUS

## 2016-08-15 NOTE — ED Notes (Signed)
Pt verbalizes understanding of d/c instructions and denies any further needs at this time. 

## 2016-08-15 NOTE — ED Provider Notes (Signed)
[facility]   CSN: [id number] Arrival date & time: 08/15/16  1850  By signing my name below, I, Daniel Valencia, attest that this documentation has been prepared under the direction and in the presence of [name], MD. Electronically Signed: Alyssa Valencia, ED Scribe. 08/15/16. 7:47 PM.   History   Chief Complaint Chief Complaint  Patient presents with  . Palpitations   The history is provided by the patient. No language interpreter was used.   HPI Comments: Daniel Valencia is a 51 y.o. male with PMHx of HTN and HLD who presents to the Emergency Department complaining of acute onset, intermittent, heart palpitations for 3 days. Pt describes his heart palpitations as an irregular heart beat or as a "butterflies" sensation in his chest. He has experienced similar palpitations with prior medication change. Pt notes his PCP recently increased his Amlodipine to 20 mg. Pt has had coffee today, but states he normally does not have coffee. Denies recent energy drinks. Denies lightheadedness, dizziness, chest pain, chest tightness, shortness of breath, leg swelling, nausea, vomiting, diarrhea.  Past Medical History:  Diagnosis Date  . Anxiety 12/30/2011  . DDD (degenerative disc disease)   . High cholesterol   . Hyperlipidemia   . Hypertension   . Unspecified asthma(493.90)     Patient Active Problem List   Diagnosis Date Noted  . Left arm numbness 12/14/2013  . TIA (transient ischemic attack) 11/18/2013  . Ingrown nail 07/07/2013  . Pain, joint, foot, right 07/07/2013  . Bipolar affective disorder (HCC) 02/21/2012  . Allergic rhinitis, cause unspecified 02/21/2012  . Concentration deficit 02/21/2012  . Anxiety disorder 12/30/2011  . Chronic lower back pain 11/12/2011  . Hyperlipidemia   . Routine general medical examination at a health care facility 11/09/2011  . Unspecified asthma(493.90)   . Hypertension     Past Surgical History:  Procedure  Laterality Date  . CHOLECYSTECTOMY    . right foot surgury  apirl 2013   [facility]  . right leg surgury     pin in right leg       Home Medications    Prior to Admission medications   Medication Sig Start Date End Date Taking? Authorizing Provider  albuterol (PROVENTIL HFA;VENTOLIN HFA) 108 (90 Base) MCG/ACT inhaler Inhale 2 puffs into the lungs every 4 (four) hours as needed for wheezing or shortness of breath. 05/08/16  Yes Shon Baton, MD  amLODipine-olmesartan (AZOR) 5-40 MG per tablet Take 1 tablet by mouth daily.   Yes Historical Provider, MD  chlorpheniramine (CHLOR-TRIMETON) 4 MG tablet Take 1 tablet (4 mg total) by mouth 2 (two) times daily as needed for allergies or rhinitis. 11/08/15  Yes Charles M Beers, PA-C  ipratropium-albuterol (DUONEB) 0.5-2.5 (3) MG/3ML SOLN Take 3 mLs by nebulization every 6 (six) hours as needed. 11/08/15  Yes Charmayne Sheer Beers, PA-C  multivitamin (ONE-A-DAY MEN'S) TABS Take 1 tablet by mouth daily.   Yes Historical Provider, MD  valsartan (DIOVAN) 80 MG tablet Take 80 mg by mouth daily.   Yes Historical Provider, MD    Family History Family History  Problem Relation Age of Onset  . Hypertension Father   . Diabetes Sister   . High blood pressure Sister   . High blood pressure Sister   . Stroke Neg Hx   . Heart attack Neg Hx     Social History Social History  Substance Use Topics  . Smoking status: Current Every Day Smoker    Packs/day:  1.00    Years: 32.00    Types: Cigarettes  . Smokeless tobacco: Never Used  . Alcohol use Yes     Comment: occ     Allergies   Peanuts [peanut oil]   Review of Systems Review of Systems  Constitutional: Negative for chills, fatigue and fever.  Respiratory: Negative for chest tightness, shortness of breath and wheezing.   Cardiovascular: Positive for palpitations. Negative for chest pain and leg swelling.  Gastrointestinal: Negative for abdominal pain, diarrhea, nausea and vomiting.    Genitourinary: Negative for dysuria, flank pain and frequency.       +Malodorous urine  Musculoskeletal: Negative for back pain and neck pain.  Skin: Negative for rash and wound.  Neurological: Negative for dizziness, weakness, light-headedness, numbness and headaches.  All other systems reviewed and are negative.    Physical Exam Updated Vital Signs BP 134/77   Pulse 86   Temp 98.1 F (36.7 C)   Resp 17   Ht 5\' 9"  (1.753 m)   Wt 214 lb 3.2 oz (97.2 kg)   SpO2 96%   BMI 31.63 kg/m   Physical Exam  Constitutional: He is oriented to person, place, and time. He appears well-developed and well-nourished. No distress.  Mildly anxious appearing  HENT:  Head: Normocephalic and atraumatic.  Mouth/Throat: Oropharynx is clear and moist.  Eyes: EOM are normal. Pupils are equal, round, and reactive to light.  Neck: Normal range of motion. Neck supple. No thyromegaly present.  Cardiovascular: Normal rate and regular rhythm.  Exam reveals no gallop and no friction rub.   No murmur heard. Pulmonary/Chest: Effort normal and breath sounds normal. No respiratory distress. He has no wheezes. He has no rales. He exhibits no tenderness.  Abdominal: Soft. Bowel sounds are normal. There is no tenderness. There is no rebound and no guarding.  Musculoskeletal: Normal range of motion. He exhibits no edema or tenderness.  No lower ext swelling, asymmetry or tenderness.  Neurological: He is alert and oriented to person, place, and time.  Moves all extremities without deficit. Sensation fully intact. No tremor present.  Skin: Skin is warm and dry. No rash noted. No erythema.  Psychiatric: He has a normal mood and affect. His behavior is normal.  Nursing note and vitals reviewed.    ED Treatments / Results  DIAGNOSTIC STUDIES: Oxygen Saturation is 99% on RA, normal by my interpretation.    COORDINATION OF CARE: 7:17 PM Discussed treatment plan with pt at bedside which includes IV Fluids, CBC  with differential, CMP, CK and D-dimer and pt agreed to plan.  Labs (all labs ordered are listed, but only abnormal results are displayed) Labs Reviewed  COMPREHENSIVE METABOLIC PANEL - Abnormal; Notable for the following:       Result Value   Glucose, Bld 100 (*)    Calcium 8.8 (*)    All other components within normal limits  CBC WITH DIFFERENTIAL/PLATELET  CK  D-DIMER, QUANTITATIVE (NOT AT [facility])  MAGNESIUM    EKG  EKG Interpretation  Date/Time:  Wednesday August 15 2016 18:59:23 EDT Ventricular Rate:  87 PR Interval:    QRS Duration: 97 QT Interval:  350 QTC Calculation: 421 R Axis:   25 Text Interpretation:  Sinus rhythm Confirmed by Ranae PalmsYELVERTON  MD, Everson Mott ([id number]) on 08/15/2016 9:07:44 PM       Radiology No results found.  Procedures Procedures (including critical care time)  Medications Ordered in ED Medications  sodium chloride 0.9 % bolus 1,000 mL (0 mLs Intravenous  Stopped 08/15/16 2050)     Initial Impression / Assessment and Plan / ED Course  I have reviewed the triage vital signs and the nursing notes.  Pertinent labs & imaging results that were available during my care of the patient were reviewed by me and considered in my medical decision making (see chart for details).     I personally performed the services described in this documentation, which was scribed in my presence. The recorded information has been reviewed and is accurate.   Patient with witnessed PACs on monitor which were causing his symptoms. Given IV fluids. States he is not having any more palpitations. Likely dehydration and caffeine ingestion responsible for his occasional PACs. He's advised to follow-up with cardiology should symptoms persist. Return precautions given.  Final Clinical Impressions(s) / ED Diagnoses   Final diagnoses:  PAC (premature atrial contraction)  Intermittent palpitations    New Prescriptions Discharge Medication List as of 08/15/2016  8:56 PM        [name], MD 08/15/16 2107

## 2016-08-15 NOTE — ED Notes (Signed)
Pt c/o palpitations for the last three days that became more consistent today.  He drives a lot of work and has a hx of rhabdo in December.  Pt denies any cramping today and denies CP and SOB.

## 2016-08-15 NOTE — Discharge Instructions (Signed)
Avoid caffeine and all stimulants. Make sure drinking plenty of fluid. Follow up with the cardiologist if your symptoms persist.

## 2016-08-15 NOTE — ED Triage Notes (Signed)
C/o palpitations and he has to catch his breath intermittant x 3 days. Today feeling has been constant. PT states MD upped his BP med 1 month ago.

## 2016-12-15 ENCOUNTER — Emergency Department (HOSPITAL_BASED_OUTPATIENT_CLINIC_OR_DEPARTMENT_OTHER)
Admission: EM | Admit: 2016-12-15 | Discharge: 2016-12-16 | Disposition: A | Discharge status: Home / Self Care | Payer: BLUE CROSS/BLUE SHIELD | Attending: Emergency Medicine | Admitting: Emergency Medicine

## 2016-12-15 ENCOUNTER — Encounter (HOSPITAL_BASED_OUTPATIENT_CLINIC_OR_DEPARTMENT_OTHER): Payer: Self-pay | Admitting: Emergency Medicine

## 2016-12-15 DIAGNOSIS — Z79899 Other long term (current) drug therapy: Secondary | ICD-10-CM | POA: Insufficient documentation

## 2016-12-15 DIAGNOSIS — I491 Atrial premature depolarization: Secondary | ICD-10-CM

## 2016-12-15 DIAGNOSIS — I10 Essential (primary) hypertension: Secondary | ICD-10-CM | POA: Insufficient documentation

## 2016-12-15 DIAGNOSIS — F17210 Nicotine dependence, cigarettes, uncomplicated: Secondary | ICD-10-CM | POA: Insufficient documentation

## 2016-12-15 DIAGNOSIS — R002 Palpitations: Secondary | ICD-10-CM | POA: Insufficient documentation

## 2016-12-15 DIAGNOSIS — J45909 Unspecified asthma, uncomplicated: Secondary | ICD-10-CM | POA: Insufficient documentation

## 2016-12-15 DIAGNOSIS — Z91010 Allergy to peanuts: Secondary | ICD-10-CM | POA: Insufficient documentation

## 2016-12-15 LAB — BASIC METABOLIC PANEL
ANION GAP: 11 (ref 5–15)
BUN: 15 mg/dL (ref 6–20)
CHLORIDE: 102 mmol/L (ref 101–111)
CO2: 23 mmol/L (ref 22–32)
Calcium: 9 mg/dL (ref 8.9–10.3)
Creatinine, Ser: 0.89 mg/dL (ref 0.61–1.24)
GFR calc Af Amer: >60 mL/min (ref >60)
GFR calc non Af Amer: >60 mL/min (ref >60)
Glucose, Bld: 117 mg/dL — ABNORMAL HIGH (ref 65–99)
Potassium: 3.5 mmol/L (ref 3.5–5.1)
SODIUM: 136 mmol/L (ref 135–145)

## 2016-12-15 LAB — CBC WITH DIFFERENTIAL/PLATELET
BASOS PCT: 1 %
Basophils Absolute: 0 10*3/uL (ref 0.0–0.1)
Eosinophils Absolute: 0.5 10*3/uL (ref 0.0–0.7)
Eosinophils Relative: 8 %
HEMATOCRIT: 40.9 % (ref 39.0–52.0)
HEMOGLOBIN: 14.8 g/dL (ref 13.0–17.0)
LYMPHS ABS: 2.7 10*3/uL (ref 0.7–4.0)
Lymphocytes Relative: 46 %
MCH: 32.2 pg (ref 26.0–34.0)
MCHC: 36.2 g/dL — AB (ref 30.0–36.0)
MCV: 88.9 fL (ref 78.0–100.0)
MONOS PCT: 8 %
Monocytes Absolute: 0.5 10*3/uL (ref 0.1–1.0)
NEUTROS ABS: 2.2 10*3/uL (ref 1.7–7.7)
NEUTROS PCT: 37 %
Platelets: 294 10*3/uL (ref 150–400)
RBC: 4.6 MIL/uL (ref 4.22–5.81)
RDW: 13.1 % (ref 11.5–15.5)
WBC: 5.8 10*3/uL (ref 4.0–10.5)

## 2016-12-15 LAB — TROPONIN I: Troponin I: <0.03 ng/mL (ref <0.03)

## 2016-12-15 MED ORDER — SODIUM CHLORIDE 0.9 % IV BOLUS (SEPSIS)
1000.0000 mL | Freq: Once | INTRAVENOUS | Status: AC
  Administered 2016-12-15: 1000 mL via INTRAVENOUS

## 2016-12-15 NOTE — ED Notes (Addendum)
Patient claimed that it takes his breath away.  Denies chest pain.  Occasional PAC's noted on the monitor.  He also stated that he smokes 1 ppd  Since his teenage years.

## 2016-12-15 NOTE — ED Provider Notes (Signed)
[facility]   CSN: [id number] Arrival date & time: 12/15/16  1723     History   Chief Complaint Chief Complaint  Patient presents with  . Irregular Heart Beat    HPI Daniel Valencia is a 51 y.o. male.  Patient is a 51 year old male with past medical history of hypertension, hyperlipidemia, and anxiety. He presents for evaluation of palpitations. He states this has been ongoing for the past several days. He denies any chest pain, difficulty breathing, fevers, or other complaints. He does report he has been consuming more alcohol than normal recently when going out with friends after work. He was seen in the past and told that he had PACs and this feels similar. He denies any excessive caffeine consumption.   The history is provided by the patient.  Palpitations   This is a recurrent problem. The current episode started 2 days ago. The problem occurs constantly. The problem has not changed since onset.Pertinent negatives include no fever, no chest pressure, no nausea, no vomiting and no shortness of breath. He has tried nothing for the symptoms. The treatment provided no relief.    Past Medical History:  Diagnosis Date  . Anxiety 12/30/2011  . DDD (degenerative disc disease)   . High cholesterol   . Hyperlipidemia   . Hypertension   . Unspecified asthma(493.90)     Patient Active Problem List   Diagnosis Date Noted  . Left arm numbness 12/14/2013  . TIA (transient ischemic attack) 11/18/2013  . Ingrown nail 07/07/2013  . Pain, joint, foot, right 07/07/2013  . Bipolar affective disorder (HCC) 02/21/2012  . Allergic rhinitis, cause unspecified 02/21/2012  . Concentration deficit 02/21/2012  . Anxiety disorder 12/30/2011  . Chronic lower back pain 11/12/2011  . Hyperlipidemia   . Routine general medical examination at a health care facility 11/09/2011  . Unspecified asthma(493.90)   . Hypertension     Past Surgical History:  Procedure Laterality  Date  . CHOLECYSTECTOMY    . right foot surgury  apirl 2013   [facility]  . right leg surgury     pin in right leg       Home Medications    Prior to Admission medications   Medication Sig Start Date End Date Taking? Authorizing Provider  albuterol (PROVENTIL HFA;VENTOLIN HFA) 108 (90 Base) MCG/ACT inhaler Inhale 2 puffs into the lungs every 4 (four) hours as needed for wheezing or shortness of breath. 05/08/16   Horton, Mayer Maskerourtney F, MD  amLODipine-olmesartan (AZOR) 5-40 MG per tablet Take 1 tablet by mouth daily.    [provider]  chlorpheniramine (CHLOR-TRIMETON) 4 MG tablet Take 1 tablet (4 mg total) by mouth 2 (two) times daily as needed for allergies or rhinitis. 11/08/15   Beers, Charmayne Sheerharles M, PA-C  ipratropium-albuterol (DUONEB) 0.5-2.5 (3) MG/3ML SOLN Take 3 mLs by nebulization every 6 (six) hours as needed. 11/08/15   Beers, Charmayne Sheerharles M, PA-C  multivitamin (ONE-A-DAY MEN'S) TABS Take 1 tablet by mouth daily.    [provider]  valsartan (DIOVAN) 80 MG tablet Take 80 mg by mouth daily.    [provider]    Family History Family History  Problem Relation Age of Onset  . Hypertension Father   . Diabetes Sister   . High blood pressure Sister   . High blood pressure Sister   . Stroke Neg Hx   . Heart attack Neg Hx     Social History Social History  Substance Use Topics  .  Smoking status: Current Every Day Smoker    Packs/day: 1.00    Years: 32.00    Types: Cigarettes  . Smokeless tobacco: Never Used  . Alcohol use Yes     Comment: occ     Allergies   Peanuts [peanut oil]   Review of Systems Review of Systems  Constitutional: Negative for fever.  Respiratory: Negative for shortness of breath.   Cardiovascular: Positive for palpitations.  Gastrointestinal: Negative for nausea and vomiting.  All other systems reviewed and are negative.    Physical Exam Updated Vital Signs BP 122/86 (BP Location: Right Arm)   Pulse (!) 101   Resp  (!) 21   Ht 5\' 9"  (1.753 m)   Wt 95.3 kg (210 lb)   SpO2 97%   BMI 31.01 kg/m   Physical Exam  Constitutional: He is oriented to person, place, and time. He appears well-developed and well-nourished. No distress.  HENT:  Head: Normocephalic and atraumatic.  Mouth/Throat: Oropharynx is clear and moist.  Neck: Normal range of motion. Neck supple.  Cardiovascular: Normal rate and regular rhythm.  Exam reveals no friction rub.   No murmur heard. Pulmonary/Chest: Effort normal and breath sounds normal. No respiratory distress. He has no wheezes. He has no rales.  Abdominal: Soft. Bowel sounds are normal. He exhibits no distension. There is no tenderness.  Musculoskeletal: Normal range of motion. He exhibits no edema.  Neurological: He is alert and oriented to person, place, and time. Coordination normal.  Skin: Skin is warm and dry. He is not diaphoretic.  Nursing note and vitals reviewed.    ED Treatments / Results  Labs (all labs ordered are listed, but only abnormal results are displayed) Labs Reviewed - No data to display  EKG ED ECG REPORT   Date: 12/15/2016  Rate: 98  Rhythm: normal sinus rhythm with pac's  QRS Axis: normal  Intervals: normal  ST/T Wave abnormalities: normal  Conduction Disutrbances:none  Narrative Interpretation:   Old EKG Reviewed: none available  I have personally reviewed the EKG tracing and agree with the computerized printout as noted.   Radiology No results found.  Procedures Procedures (including critical care time)  Medications Ordered in ED Medications - No data to display   Initial Impression / Assessment and Plan / ED Course  I have reviewed the triage vital signs and the nursing notes.  Pertinent labs & imaging results that were available during my care of the patient were reviewed by me and considered in my medical decision making (see chart for details).  Patient presents with complaints of palpitations. He reports  occasional skipped beats. He has been told in the past that he has PACs. That is what he seems to be experiencing this evening. He is describing a skipped beat and when I observe the monitor I am seeing PACs.  He does report ingesting more alcohol recently. I've advised him that caffeine and alcohol can be causes of this and advised him to refrain from it. His workup reveals no significant cardiac abnormality on his EKG or laboratory studies. His electrolytes are unremarkable area and I see no indication for any further workup. To follow-up as needed for any problems.  Final Clinical Impressions(s) / ED Diagnoses   Final diagnoses:  None    New Prescriptions New Prescriptions   No medications on file     Geoffery Lyons, MD 12/16/16 2231

## 2016-12-15 NOTE — ED Notes (Signed)
ED Provider at bedside. 

## 2016-12-15 NOTE — ED Triage Notes (Signed)
Pt c/o irreg HR, onset 3 days ago, but constant now; denies CP, but feels SHOB.

## 2016-12-15 NOTE — ED Notes (Signed)
Pt on cardiac monitor and auto VS 

## 2016-12-15 NOTE — Discharge Instructions (Signed)
Limit your alcohol and caffeine consumption.  Follow-up with your primary Dr. if not improving in the next few days, and return to the ER if symptoms significantly worsen or change.

## 2017-04-28 ENCOUNTER — Other Ambulatory Visit: Payer: Self-pay

## 2017-04-28 ENCOUNTER — Encounter (HOSPITAL_COMMUNITY): Payer: Self-pay | Admitting: *Deleted

## 2017-04-28 ENCOUNTER — Ambulatory Visit (HOSPITAL_COMMUNITY)
Admission: EM | Admit: 2017-04-28 | Discharge: 2017-04-28 | Disposition: A | Discharge status: Home / Self Care | Payer: BLUE CROSS/BLUE SHIELD | Attending: Family Medicine | Admitting: Family Medicine

## 2017-04-28 DIAGNOSIS — J029 Acute pharyngitis, unspecified: Secondary | ICD-10-CM | POA: Insufficient documentation

## 2017-04-28 DIAGNOSIS — F17210 Nicotine dependence, cigarettes, uncomplicated: Secondary | ICD-10-CM | POA: Insufficient documentation

## 2017-04-28 HISTORY — DX: Unspecified asthma, uncomplicated: J45.909

## 2017-04-28 LAB — POCT RAPID STREP A: Streptococcus, Group A Screen (Direct): NEGATIVE

## 2017-04-28 MED ORDER — AMOXICILLIN 875 MG PO TABS
875.0000 mg | ORAL_TABLET | Freq: Two times a day (BID) | ORAL | 0 refills | Status: DC
Start: 2017-04-28 — End: 2017-07-08

## 2017-04-28 MED ORDER — HYDROCODONE-HOMATROPINE 5-1.5 MG/5ML PO SYRP: 5.0000 mL | ORAL_SOLUTION | Freq: Four times a day (QID) | ORAL | 0 refills | Status: DC | PRN

## 2017-04-28 NOTE — Discharge Instructions (Signed)
Initial strep test is negative so we are running a more comprehensive test that will take 2 days to complete and we will call you midweek with results of that.  In the meantime, stay on the amoxicillin

## 2017-04-28 NOTE — ED Triage Notes (Signed)
C/O sore throat x 3 days with worsening, cough x 3 days.  Denies fevers.

## 2017-04-28 NOTE — ED Provider Notes (Signed)
[facility]   [id number] 04/28/17 Arrival Time: 1358   SUBJECTIVE:  Daniel RivalDirk D Valencia is a 51 y.o. male who presents to the urgent care with complaint of sore throat x 3 days with worsening, cough x 3 days.  Denies fevers.     Past Medical History:  Diagnosis Date  . Anxiety 12/30/2011  . Asthma   . DDD (degenerative disc disease)   . High cholesterol   . Hyperlipidemia   . Hypertension   . Unspecified asthma(493.90)    Family History  Problem Relation Age of Onset  . Hypertension Father   . Diabetes Sister   . High blood pressure Sister   . High blood pressure Sister   . Stroke Neg Hx   . Heart attack Neg Hx    Social History   Socioeconomic History  . Marital status: Single    Spouse name: Not on file  . Number of children: 0  . Years of education: HS  . Highest education level: Not on file  Social Needs  . Financial resource strain: Not on file  . Food insecurity - worry: Not on file  . Food insecurity - inability: Not on file  . Transportation needs - medical: Not on file  . Transportation needs - non-medical: Not on file  Occupational History  . 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 Smoking status: Current Some Day Smoker    Packs/day: 1.00    Years: 32.00    Pack years: 32.00    Types: Cigarettes  . Smokeless tobacco: Never Used  Substance and Sexual Activity  . Alcohol use: No    Frequency: Never  . Drug use: No  . Sexual activity: Not on file  Other Topics Concern  . Not on file  Social History Narrative   Patient lives at home with his roommate.   Caffeine Use: Yes   Current Meds  Medication Sig  . albuterol (PROVENTIL HFA;VENTOLIN HFA) 108 (90 Base) MCG/ACT inhaler Inhale 2 puffs into the lungs every 4 (four) hours as needed for wheezing or shortness of breath.  . AMLODIPINE BESYLATE PO Take by mouth.  Marland Kitchen. LOSARTAN POTASSIUM PO Take by mouth.   Allergies  Allergen Reactions  .  Peanuts [Peanut Oil] Anaphylaxis      ROS: As per HPI, remainder of ROS negative.   OBJECTIVE:   Vitals:   04/28/17 1443  BP: 135/84  Pulse: 89  Resp: 16  Temp: 98.1 F (36.7 C)  TempSrc: Oral  SpO2: 98%     General appearance: alert; no distress Eyes: PERRL; EOMI; conjunctiva normal HENT: normocephalic; atraumatic; TMs normal, canal normal, external ears normal without trauma; nasal mucosa normal; oral mucosa normal Neck: supple Lungs: clear to auscultation bilaterally Heart: regular rate and rhythm Back: no CVA tenderness Extremities: no cyanosis or edema; symmetrical with no gross deformities Skin: warm and dry Neurologic: normal gait; grossly normal Psychological: alert and cooperative; normal mood and affect      Labs:  Results for orders placed or performed during the hospital encounter of 04/28/17  POCT rapid strep A [facility]  Result Value Ref Range   Streptococcus, Group A Screen (Direct) NEGATIVE NEGATIVE    Labs Reviewed  CULTURE, GROUP A STREP [facility])  POCT RAPID STREP A    No results found.     ASSESSMENT & PLAN:  1. Pharyngitis, unspecified etiology     Meds ordered this encounter  Medications  . amoxicillin (AMOXIL) 875 MG tablet    Sig: Take 1 tablet (875 mg total) by mouth 2 (two) times daily.    Dispense:  20 tablet    Refill:  0  . HYDROcodone-homatropine (HYDROMET) 5-1.5 MG/5ML syrup    Sig: Take 5 mLs by mouth every 6 (six) hours as needed for cough.    Dispense:  60 mL    Refill:  0    Reviewed expectations re: course of current medical issues. Questions answered. Outlined signs and symptoms indicating need for more acute intervention. Patient verbalized understanding. After Visit Summary given.    Procedures:      Elvina SidleLauenstein, Nakyra Bourn, MD 04/28/17 1506

## 2017-05-01 LAB — CULTURE, GROUP A STREP (THRC)

## 2017-07-08 ENCOUNTER — Encounter (HOSPITAL_COMMUNITY): Payer: Self-pay | Admitting: Family Medicine

## 2017-07-08 ENCOUNTER — Telehealth (HOSPITAL_COMMUNITY): Payer: Self-pay | Admitting: Emergency Medicine

## 2017-07-08 ENCOUNTER — Ambulatory Visit (HOSPITAL_COMMUNITY)
Admission: EM | Admit: 2017-07-08 | Discharge: 2017-07-08 | Disposition: A | Discharge status: Home / Self Care | Payer: Self-pay | Attending: Family Medicine | Admitting: Family Medicine

## 2017-07-08 ENCOUNTER — Other Ambulatory Visit: Payer: Self-pay

## 2017-07-08 DIAGNOSIS — F17210 Nicotine dependence, cigarettes, uncomplicated: Secondary | ICD-10-CM | POA: Insufficient documentation

## 2017-07-08 DIAGNOSIS — F319 Bipolar disorder, unspecified: Secondary | ICD-10-CM | POA: Insufficient documentation

## 2017-07-08 DIAGNOSIS — Z79899 Other long term (current) drug therapy: Secondary | ICD-10-CM | POA: Insufficient documentation

## 2017-07-08 DIAGNOSIS — R05 Cough: Secondary | ICD-10-CM | POA: Insufficient documentation

## 2017-07-08 DIAGNOSIS — I10 Essential (primary) hypertension: Secondary | ICD-10-CM | POA: Insufficient documentation

## 2017-07-08 DIAGNOSIS — E7800 Pure hypercholesterolemia, unspecified: Secondary | ICD-10-CM | POA: Insufficient documentation

## 2017-07-08 DIAGNOSIS — F419 Anxiety disorder, unspecified: Secondary | ICD-10-CM | POA: Insufficient documentation

## 2017-07-08 DIAGNOSIS — J00 Acute nasopharyngitis [common cold]: Secondary | ICD-10-CM | POA: Insufficient documentation

## 2017-07-08 DIAGNOSIS — E785 Hyperlipidemia, unspecified: Secondary | ICD-10-CM | POA: Insufficient documentation

## 2017-07-08 DIAGNOSIS — L600 Ingrowing nail: Secondary | ICD-10-CM | POA: Insufficient documentation

## 2017-07-08 DIAGNOSIS — G8929 Other chronic pain: Secondary | ICD-10-CM | POA: Insufficient documentation

## 2017-07-08 DIAGNOSIS — J45909 Unspecified asthma, uncomplicated: Secondary | ICD-10-CM | POA: Insufficient documentation

## 2017-07-08 DIAGNOSIS — Z8673 Personal history of transient ischemic attack (TIA), and cerebral infarction without residual deficits: Secondary | ICD-10-CM | POA: Insufficient documentation

## 2017-07-08 DIAGNOSIS — J029 Acute pharyngitis, unspecified: Secondary | ICD-10-CM | POA: Insufficient documentation

## 2017-07-08 DIAGNOSIS — H9201 Otalgia, right ear: Secondary | ICD-10-CM | POA: Insufficient documentation

## 2017-07-08 LAB — POCT RAPID STREP A: STREPTOCOCCUS, GROUP A SCREEN (DIRECT): NEGATIVE

## 2017-07-08 MED ORDER — CETIRIZINE-PSEUDOEPHEDRINE ER 5-120 MG PO TB12: 1.0000 | ORAL_TABLET | Freq: Every day | ORAL | 0 refills | Status: DC

## 2017-07-08 MED ORDER — LIDOCAINE VISCOUS 2 % MT SOLN: 15.0000 mL | OROMUCOSAL | 0 refills | Status: DC | PRN

## 2017-07-08 MED ORDER — FLUTICASONE PROPIONATE 50 MCG/ACT NA SUSP: 2.0000 | Freq: Every day | NASAL | 0 refills | Status: DC

## 2017-07-08 MED ORDER — BENZONATATE 100 MG PO CAPS: 100.0000 mg | ORAL_CAPSULE | Freq: Three times a day (TID) | ORAL | 0 refills | Status: DC

## 2017-07-08 NOTE — Discharge Instructions (Signed)
Rapid strep negative. Symptoms are most likely due to viral illness. You can gurgle lidocaine solution for sore throat, please do not eat or drink within 30 mins after use of lidocaine, as it can stunt your gag reflex. Tessalon for cough. Flonase and/or Zyrtec-D for nasal congestion/drainage. You can use over the counter nasal saline rinse such as neti pot for nasal congestion. Monitor for any worsening of symptoms, swelling of the throat, trouble breathing, trouble swallowing, follow up for reevaluation.   For sore throat try using a honey-based tea. Use 3 teaspoons of honey with juice squeezed from half lemon. Place shaved pieces of ginger into 1/2-1 cup of water and warm over stove top. Then mix the ingredients and repeat every 4 hours as needed.

## 2017-07-08 NOTE — ED Triage Notes (Signed)
Onset 2 days ago of feeling bad, throat burning, right side of face is hurting.

## 2017-07-08 NOTE — ED Provider Notes (Signed)
[facility]    CSN: [id number] Arrival date & time: 07/08/17  1129     History   Chief Complaint Chief Complaint  Patient presents with  . Otalgia  . Sore Throat    HPI Daniel Valencia is a 52 y.o. male.   52 year old male with history of asthma, HLD, HTN comes in for 2-day history of URI symptoms.  He has had sore throat, right-sided facial pain, nonproductive cough, right ear pain.  He had subjective fever yesterday, and took aspirin and Tylenol for it. otc mucinex without relief. Current every day smoker, about half a pack a day, has been smoking for about 30 years, and was at one point at a pack a day.      Past Medical History:  Diagnosis Date  . Anxiety 12/30/2011  . Asthma   . DDD (degenerative disc disease)   . High cholesterol   . Hyperlipidemia   . Hypertension   . Unspecified asthma(493.90)     Patient Active Problem List   Diagnosis Date Noted  . Left arm numbness 12/14/2013  . TIA (transient ischemic attack) 11/18/2013  . Ingrown nail 07/07/2013  . Pain, joint, foot, right 07/07/2013  . Bipolar affective disorder (HCC) 02/21/2012  . Allergic rhinitis, cause unspecified 02/21/2012  . Concentration deficit 02/21/2012  . Anxiety disorder 12/30/2011  . Chronic lower back pain 11/12/2011  . Hyperlipidemia   . Routine general medical examination at a health care facility 11/09/2011  . Unspecified asthma(493.90)   . Hypertension     Past Surgical History:  Procedure Laterality Date  . CHOLECYSTECTOMY    . right foot surgury  apirl 2013   [facility]  . right leg surgury     pin in right leg       Home Medications    Prior to Admission medications   Medication Sig Start Date End Date Taking? Authorizing Provider  AMLODIPINE BESYLATE PO Take by mouth.   Yes [provider]  LISINOPRIL PO Take by mouth.   Yes [provider]  albuterol (PROVENTIL HFA;VENTOLIN HFA) 108 (90 Base) MCG/ACT inhaler Inhale 2 puffs into the  lungs every 4 (four) hours as needed for wheezing or shortness of breath. 05/08/16   Horton, Mayer Masker, MD  benzonatate (TESSALON) 100 MG capsule Take 1 capsule (100 mg total) by mouth every 8 (eight) hours. 07/08/17   Cathie Hoops, Johnny Gorter V, PA-C  cetirizine-pseudoephedrine (ZYRTEC-D) 5-120 MG tablet Take 1 tablet by mouth daily. 07/08/17   Cathie Hoops, Adreyan Carbajal V, PA-C  fluticasone (FLONASE) 50 MCG/ACT nasal spray Place 2 sprays into both nostrils daily. 07/08/17   Cathie Hoops, Taylour Lietzke V, PA-C  lidocaine (XYLOCAINE) 2 % solution Use as directed 15 mLs in the mouth or throat as needed for mouth pain. 07/08/17   Cathie Hoops, Korban Shearer V, PA-C  multivitamin (ONE-A-DAY MEN'S) TABS Take 1 tablet by mouth daily.    [provider]  gabapentin (NEURONTIN) 300 MG capsule Take 1 capsule (300 mg total) by mouth at bedtime. 12/14/13 11/08/15  Penumalli, Glenford Bayley, MD    Family History Family History  Problem Relation Age of Onset  . Hypertension Father   . Diabetes Sister   . High blood pressure Sister   . High blood pressure Sister   . Stroke Neg Hx   . Heart attack Neg Hx     Social History Social History   Tobacco Use  . Smoking status: Current Some Day Smoker    Packs/day: 1.00    Years:  32.00    Pack years: 32.00    Types: Cigarettes  . Smokeless tobacco: Never Used  Substance Use Topics  . Alcohol use: No    Frequency: Never  . Drug use: No     Allergies   Peanuts [peanut oil]   Review of Systems Review of Systems  Reason unable to perform ROS: See HPI as above.     Physical Exam Triage Vital Signs ED Triage Vitals  Enc Vitals Group     BP 07/08/17 1304 (!) 133/92     Pulse Rate 07/08/17 1304 94     Resp 07/08/17 1304 20     Temp 07/08/17 1304 98.7 F (37.1 C)     Temp Source 07/08/17 1304 Oral     SpO2 07/08/17 1304 98 %     Weight --      Height --      Head Circumference --      Peak Flow --      Pain Score 07/08/17 1305 10     Pain Loc --      Pain Edu? --      Excl. in GC? --    No data found.  Updated  Vital Signs BP (!) 133/92 (BP Location: Left Arm) Comment: large cuff  Pulse 94   Temp 98.7 F (37.1 C) (Oral)   Resp 20   SpO2 98%   Physical Exam  Constitutional: He is oriented to person, place, and time. He appears well-developed and well-nourished. No distress.  HENT:  Head: Normocephalic and atraumatic.  Right Ear: External ear and ear canal normal. Tympanic membrane is erythematous. Tympanic membrane is not bulging.  Left Ear: External ear and ear canal normal. Tympanic membrane is erythematous. Tympanic membrane is not bulging.  Nose: Mucosal edema and rhinorrhea present. Right sinus exhibits maxillary sinus tenderness and frontal sinus tenderness. Left sinus exhibits no maxillary sinus tenderness and no frontal sinus tenderness.  Mouth/Throat: Uvula is midline and mucous membranes are normal. Posterior oropharyngeal erythema present. No tonsillar exudate.  Eyes: Conjunctivae are normal. Pupils are equal, round, and reactive to light.  Neck: Normal range of motion. Neck supple.  Cardiovascular: Normal rate, regular rhythm and normal heart sounds. Exam reveals no gallop and no friction rub.  No murmur heard. Pulmonary/Chest: Effort normal and breath sounds normal. He has no decreased breath sounds. He has no wheezes. He has no rhonchi. He has no rales.  Lymphadenopathy:    He has no cervical adenopathy.  Neurological: He is alert and oriented to person, place, and time.  Skin: Skin is warm and dry.  Psychiatric: He has a normal mood and affect. His behavior is normal. Judgment normal.    UC Treatments / Results  Labs (all labs ordered are listed, but only abnormal results are displayed) Labs Reviewed  CULTURE, GROUP A STREP [facility])  POCT RAPID STREP A    EKG  EKG Interpretation None       Radiology No results found.  Procedures Procedures (including critical care time)  Medications Ordered in UC Medications - No data to display   Initial Impression /  Assessment and Plan / UC Course  I have reviewed the triage vital signs and the nursing notes.  Pertinent labs & imaging results that were available during my care of the patient were reviewed by me and considered in my medical decision making (see chart for details).    Rapid strep negative. Symptomatic treatment as needed. Return precautions given.   Final  Clinical Impressions(s) / UC Diagnoses   Final diagnoses:  Acute nasopharyngitis    ED Discharge Orders        Ordered    benzonatate (TESSALON) 100 MG capsule  Every 8 hours     07/08/17 1333    fluticasone (FLONASE) 50 MCG/ACT nasal spray  Daily     07/08/17 1333    cetirizine-pseudoephedrine (ZYRTEC-D) 5-120 MG tablet  Daily     07/08/17 1333    lidocaine (XYLOCAINE) 2 % solution  As needed     07/08/17 1334        Belinda Fisher, PA-C 07/08/17 1338

## 2017-07-11 LAB — CULTURE, GROUP A STREP (THRC)

## 2017-07-29 ENCOUNTER — Other Ambulatory Visit: Payer: Self-pay

## 2017-07-29 ENCOUNTER — Encounter (HOSPITAL_COMMUNITY): Payer: Self-pay | Admitting: Emergency Medicine

## 2017-07-29 ENCOUNTER — Emergency Department (HOSPITAL_COMMUNITY)
Admission: EM | Admit: 2017-07-29 | Discharge: 2017-07-29 | Disposition: A | Discharge status: Home / Self Care | Payer: Self-pay | Attending: Emergency Medicine | Admitting: Emergency Medicine

## 2017-07-29 ENCOUNTER — Emergency Department (HOSPITAL_COMMUNITY): Payer: Self-pay

## 2017-07-29 DIAGNOSIS — I10 Essential (primary) hypertension: Secondary | ICD-10-CM | POA: Insufficient documentation

## 2017-07-29 DIAGNOSIS — J45909 Unspecified asthma, uncomplicated: Secondary | ICD-10-CM | POA: Insufficient documentation

## 2017-07-29 DIAGNOSIS — I493 Ventricular premature depolarization: Secondary | ICD-10-CM | POA: Insufficient documentation

## 2017-07-29 DIAGNOSIS — F17210 Nicotine dependence, cigarettes, uncomplicated: Secondary | ICD-10-CM | POA: Insufficient documentation

## 2017-07-29 DIAGNOSIS — R002 Palpitations: Secondary | ICD-10-CM | POA: Insufficient documentation

## 2017-07-29 LAB — BASIC METABOLIC PANEL
ANION GAP: 11 (ref 5–15)
BUN: 16 mg/dL (ref 6–20)
CHLORIDE: 100 mmol/L — AB (ref 101–111)
CO2: 24 mmol/L (ref 22–32)
Calcium: 9.1 mg/dL (ref 8.9–10.3)
Creatinine, Ser: 0.79 mg/dL (ref 0.61–1.24)
GFR calc Af Amer: >60 mL/min (ref >60)
GFR calc non Af Amer: >60 mL/min (ref >60)
Glucose, Bld: 115 mg/dL — ABNORMAL HIGH (ref 65–99)
POTASSIUM: 3.6 mmol/L (ref 3.5–5.1)
SODIUM: 135 mmol/L (ref 135–145)

## 2017-07-29 LAB — CBC
HCT: 42.6 % (ref 39.0–52.0)
HEMOGLOBIN: 15 g/dL (ref 13.0–17.0)
MCH: 32.2 pg (ref 26.0–34.0)
MCHC: 35.2 g/dL (ref 30.0–36.0)
MCV: 91.4 fL (ref 78.0–100.0)
PLATELETS: 316 10*3/uL (ref 150–400)
RBC: 4.66 MIL/uL (ref 4.22–5.81)
RDW: 13.1 % (ref 11.5–15.5)
WBC: 6.8 10*3/uL (ref 4.0–10.5)

## 2017-07-29 LAB — I-STAT TROPONIN, ED: Troponin i, poc: 0 ng/mL (ref 0.00–0.08)

## 2017-07-29 MED ORDER — METOPROLOL TARTRATE 25 MG PO TABS: 12.5000 mg | ORAL_TABLET | Freq: Every day | ORAL | 0 refills | Status: DC | PRN

## 2017-07-29 NOTE — ED Triage Notes (Signed)
C/o fast, irregular HR that "takes my breath away."  Pt slightly anxious on arrival.

## 2017-07-29 NOTE — ED Provider Notes (Signed)
[facility]   CSN: [id number] Arrival date & time: 07/29/17  [id number]     History   Chief Complaint Chief Complaint  Patient presents with  . Irregular HR    HPI Daniel Valencia is a 52 y.o. male history of asthma, degenerative disc disease, high cholesterol, hypertension here presenting with palpitations.  She had intermittent palpitations going on for the last several months.  Yesterday he felt that his heart was racing.  Denies any chest pain or shortness of breath.  Patient denies any fever vomiting or cough.  Patient was seen previously for similar symptoms and was diagnosed with PVCs.  Patient is not currently on any medicines for his heart rate.  Patient is currently on lisinopril, Norvasc for hypertension and is not on beta-blockers or calcium channel blockers.  Denies any recent travel or history of blood clots or history of cardiac stents.  The history is provided by the patient.    Past Medical History:  Diagnosis Date  . Anxiety 12/30/2011  . Asthma   . DDD (degenerative disc disease)   . High cholesterol   . Hyperlipidemia   . Hypertension   . Unspecified asthma(493.90)     Patient Active Problem List   Diagnosis Date Noted  . Left arm numbness 12/14/2013  . TIA (transient ischemic attack) 11/18/2013  . Ingrown nail 07/07/2013  . Pain, joint, foot, right 07/07/2013  . Bipolar affective disorder (HCC) 02/21/2012  . Allergic rhinitis, cause unspecified 02/21/2012  . Concentration deficit 02/21/2012  . Anxiety disorder 12/30/2011  . Chronic lower back pain 11/12/2011  . Hyperlipidemia   . Routine general medical examination at a health care facility 11/09/2011  . Unspecified asthma(493.90)   . Hypertension     Past Surgical History:  Procedure Laterality Date  . CHOLECYSTECTOMY    . right foot surgury  apirl 2013   [facility]  . right leg surgury     pin in right leg       Home Medications    Prior  to Admission medications   Medication Sig Start Date End Date Taking? Authorizing Provider  albuterol (PROVENTIL HFA;VENTOLIN HFA) 108 (90 Base) MCG/ACT inhaler Inhale 2 puffs into the lungs every 4 (four) hours as needed for wheezing or shortness of breath. 05/08/16   Horton, Mayer Maskerourtney F, MD  AMLODIPINE BESYLATE PO Take by mouth.    [provider]  benzonatate (TESSALON) 100 MG capsule Take 1 capsule (100 mg total) by mouth every 8 (eight) hours. 07/08/17   Cathie HoopsYu, Amy V, PA-C  cetirizine-pseudoephedrine (ZYRTEC-D) 5-120 MG tablet Take 1 tablet by mouth daily. 07/08/17   Cathie HoopsYu, Amy V, PA-C  fluticasone (FLONASE) 50 MCG/ACT nasal spray Place 2 sprays into both nostrils daily. 07/08/17   Cathie HoopsYu, Amy V, PA-C  lidocaine (XYLOCAINE) 2 % solution Use as directed 15 mLs in the mouth or throat as needed for mouth pain. 07/08/17   Yu, Amy V, PA-C  LISINOPRIL PO Take by mouth.    [provider]  multivitamin (ONE-A-DAY MEN'S) TABS Take 1 tablet by mouth daily.    [provider]    Family History Family History  Problem Relation Age of Onset  . Hypertension Father   . Diabetes Sister   . High blood pressure Sister   . High blood pressure Sister   . Stroke Neg Hx   . Heart attack Neg Hx     Social History Social History   Tobacco Use  .  Smoking status: Current Some Day Smoker    Packs/day: 1.00    Years: 32.00    Pack years: 32.00    Types: Cigarettes  . Smokeless tobacco: Never Used  Substance Use Topics  . Alcohol use: No    Frequency: Never  . Drug use: No     Allergies   Peanuts [peanut oil]   Review of Systems Review of Systems  Cardiovascular: Positive for palpitations.  All other systems reviewed and are negative.    Physical Exam Updated Vital Signs BP (!) 141/95   Pulse 86   Temp 98.2 F (36.8 C) (Oral)   Resp 13   SpO2 96%   Physical Exam  Constitutional: He appears well-developed.  HENT:  Head: Normocephalic.  Mouth/Throat: Oropharynx is clear  and moist.  Eyes: Conjunctivae and EOM are normal. Pupils are equal, round, and reactive to light.  Neck: Normal range of motion. Neck supple.  Cardiovascular: Normal rate, regular rhythm and normal heart sounds.  Pulmonary/Chest: Effort normal and breath sounds normal. No stridor. No respiratory distress. He has no wheezes.  Abdominal: Soft. Bowel sounds are normal. He exhibits no distension. There is no tenderness. There is no guarding.  Musculoskeletal: Normal range of motion. He exhibits no edema or deformity.  Neurological: He is alert.  Skin: Skin is warm.  Psychiatric: He has a normal mood and affect.  Nursing note and vitals reviewed.    ED Treatments / Results  Labs (all labs ordered are listed, but only abnormal results are displayed) Labs Reviewed  BASIC METABOLIC PANEL - Abnormal; Notable for the following components:      Result Value   Chloride 100 (*)    Glucose, Bld 115 (*)    All other components within normal limits  CBC  I-STAT TROPONIN, ED    EKG  EKG Interpretation  Date/Time:  Monday July 29 2017 03:29:37 EST Ventricular Rate:  100 PR Interval:  170 QRS Duration: 100 QT Interval:  334 QTC Calculation: 430 R Axis:   34 Text Interpretation:  Sinus rhythm with Premature atrial complexes Nonspecific T wave abnormality Abnormal ECG Confirmed by Geoffery Lyons ([id number]) on 07/29/2017 5:00:25 AM Also confirmed by Geoffery Lyons ([id number]), editor Elita Quick [phone])  on 07/29/2017 6:38:51 AM       Radiology Dg Chest 2 View  Result Date: 07/29/2017 CLINICAL DATA:  52 year old male with shortness of breath. EXAM: CHEST  2 VIEW COMPARISON:  Chest radiograph dated 05/08/2016 FINDINGS: Mild interstitial coarsening and left apical pleural scarring. No focal consolidation, pleural effusion, or pneumothorax. The cardiac silhouette is within normal limits. No acute osseous pathology. IMPRESSION: No active cardiopulmonary disease. Electronically Signed   By: Elgie Collard M.D.   On: 07/29/2017 04:12    Procedures Procedures (including critical care time)  Medications Ordered in ED Medications - No data to display   Initial Impression / Assessment and Plan / ED Course  I have reviewed the triage vital signs and the nursing notes.  Pertinent labs & imaging results that were available during my care of the patient were reviewed by me and considered in my medical decision making (see chart for details).     Daniel Valencia is a 52 y.o. male here with palpitations. HR 80s on arrival, EKG showed sinus rhythm with occasional PVCs. No recent travel to suggest PE. Vitals stable. No cardiac hx and labs and trop neg. I think likely just PVCs. Recommend holter monitor with PCP, low dose metoprolol 12.5 mg as  needed for symptom control.   Final Clinical Impressions(s) / ED Diagnoses   Final diagnoses:  None    ED Discharge Orders    None       Charlynne Pander, MD 07/29/17 [phone]

## 2017-07-29 NOTE — Discharge Instructions (Signed)
Take metoprolol 12.5 daily as needed for palpitations.   Consider holter monitor with your doctor if you still have palpitations   See your doctor  Return to ER if you have worse palpitations, chest pain, trouble breathing

## 2017-11-20 ENCOUNTER — Encounter (HOSPITAL_COMMUNITY): Payer: Self-pay | Admitting: Emergency Medicine

## 2017-11-20 ENCOUNTER — Ambulatory Visit (HOSPITAL_COMMUNITY)
Admission: EM | Admit: 2017-11-20 | Discharge: 2017-11-20 | Disposition: A | Discharge status: Home / Self Care | Payer: Self-pay | Attending: Internal Medicine | Admitting: Internal Medicine

## 2017-11-20 ENCOUNTER — Ambulatory Visit (INDEPENDENT_AMBULATORY_CARE_PROVIDER_SITE_OTHER): Payer: Self-pay

## 2017-11-20 DIAGNOSIS — R05 Cough: Secondary | ICD-10-CM

## 2017-11-20 DIAGNOSIS — L2082 Flexural eczema: Secondary | ICD-10-CM

## 2017-11-20 DIAGNOSIS — J441 Chronic obstructive pulmonary disease with (acute) exacerbation: Secondary | ICD-10-CM

## 2017-11-20 DIAGNOSIS — R059 Cough, unspecified: Secondary | ICD-10-CM

## 2017-11-20 MED ORDER — TRIAMCINOLONE ACETONIDE 0.5 % EX OINT: 1.0000 "application " | TOPICAL_OINTMENT | Freq: Two times a day (BID) | CUTANEOUS | 0 refills | Status: DC

## 2017-11-20 MED ORDER — IPRATROPIUM-ALBUTEROL 0.5-2.5 (3) MG/3ML IN SOLN
RESPIRATORY_TRACT | Status: AC
  Filled 2017-11-20: qty 3

## 2017-11-20 MED ORDER — AZITHROMYCIN 250 MG PO TABS: 250.0000 mg | ORAL_TABLET | Freq: Every day | ORAL | 0 refills | Status: DC

## 2017-11-20 MED ORDER — HYDROCODONE-HOMATROPINE 5-1.5 MG/5ML PO SYRP: 5.0000 mL | ORAL_SOLUTION | Freq: Four times a day (QID) | ORAL | 0 refills | Status: AC | PRN

## 2017-11-20 MED ORDER — IPRATROPIUM-ALBUTEROL 0.5-2.5 (3) MG/3ML IN SOLN
3.0000 mL | Freq: Once | RESPIRATORY_TRACT | Status: AC
  Administered 2017-11-20: 3 mL via RESPIRATORY_TRACT

## 2017-11-20 MED ORDER — PREDNISONE 20 MG PO TABS: 20.0000 mg | ORAL_TABLET | Freq: Two times a day (BID) | ORAL | 0 refills | Status: AC

## 2017-11-20 NOTE — Discharge Instructions (Addendum)
Chest x-ray did not show signs of pneumonia, just chronic bronchitis Breathing treatment given in office Rest and push fluids Prednisone prescribed Azithromycin prescribed Hydrocodone-Homatropine syrup prescribed.  Take as needed for cough Take medications as directed and to completion Follow up with PCP as needed if symptoms persists Return or go to the ED if you have any new or worsening symptoms  Use kenalog twice daily on rash, until symptoms resolve

## 2017-11-20 NOTE — ED Triage Notes (Signed)
Pt here for cough and URI sx and rash to right arm

## 2017-11-20 NOTE — ED Provider Notes (Signed)
[facility]   [id number] 11/20/17 Arrival Time: 1454  SUBJECTIVE:  Daniel Valencia is a 52 y.o. male with hx of asthma, tobacco use, and acid reflux who presents with worsening cough for the past month.  Denies positive sick exposure or precipitating event.  Describes cough as intermittent and dry, but sometimes productive with yellow sputum.  Has tried tussionex with relief.  Symptoms are made worse with talking.  Reports previous symptoms in the past.   Complains of subjective fever, fatigue, rhinorrhea, sinus pain, sore throat, SOB, wheezing  Patient also reports eczema flare right flexural area of elbow that began 1 week ago.  Tried eucrisa without relief.  Complains of associated itching.    Denies chills, chest pain, nausea, changes in bowel or bladder habits.    ROS: As per HPI.  Past Medical History:  Diagnosis Date  . Anxiety 12/30/2011  . Asthma   . DDD (degenerative disc disease)   . High cholesterol   . Hyperlipidemia   . Hypertension   . Unspecified asthma(493.90)    Past Surgical History:  Procedure Laterality Date  . CHOLECYSTECTOMY    . right foot surgury  apirl 2013   [facility]  . right leg surgury     pin in right leg   Allergies  Allergen Reactions  . Peanuts [Peanut Oil] Anaphylaxis   No current facility-administered medications on file prior to encounter.    Current Outpatient Medications on File Prior to Encounter  Medication Sig Dispense Refill  . albuterol (PROVENTIL HFA;VENTOLIN HFA) 108 (90 Base) MCG/ACT inhaler Inhale 2 puffs into the lungs every 4 (four) hours as needed for wheezing or shortness of breath. 1 Inhaler 0  . AMLODIPINE BESYLATE PO Take by mouth.    . benzonatate (TESSALON) 100 MG capsule Take 1 capsule (100 mg total) by mouth every 8 (eight) hours. 21 capsule 0  . cetirizine-pseudoephedrine (ZYRTEC-D) 5-120 MG tablet Take 1 tablet by mouth daily. 15 tablet 0  . fluticasone (FLONASE) 50 MCG/ACT nasal spray Place 2 sprays  into both nostrils daily. 1 g 0  . lidocaine (XYLOCAINE) 2 % solution Use as directed 15 mLs in the mouth or throat as needed for mouth pain. 100 mL 0  . LISINOPRIL PO Take by mouth.    . metoprolol tartrate (LOPRESSOR) 25 MG tablet Take 0.5 tablets (12.5 mg total) by mouth daily as needed. 20 tablet 0  . multivitamin (ONE-A-DAY MEN'S) TABS Take 1 tablet by mouth daily.      Social History   Socioeconomic History  . Marital status: Single    Spouse name: Not on file  . Number of children: 0  . Years of education: HS  . Highest education level: Not on file  Occupational History  . 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 Financial resource strain: Not on file  . Food insecurity:    Worry: Not on file    Inability: Not on file  . Transportation needs:    Medical: Not on file    Non-medical: Not on file  Tobacco Use  . Smoking status: Current Some Day Smoker    Packs/day: 1.00    Years: 32.00    Pack years: 32.00    Types: Cigarettes  . Smokeless tobacco: Never Used  Substance and Sexual Activity  . Alcohol use: No    Frequency: Never  . Drug use: No  . Sexual activity: Not on file  Lifestyle  . Physical activity:    Days per week: Not on file    Minutes per session: Not on file  . Stress: Not on file  Relationships  . Social connections:    Talks on phone: Not on file    Gets together: Not on file    Attends religious service: Not on file    Active member of club or organization: Not on file    Attends meetings of clubs or organizations: Not on file    Relationship status: Not on file  . Intimate partner violence:    Fear of current or ex partner: Not on file    Emotionally abused: Not on file    Physically abused: Not on file    Forced sexual activity: Not on file  Other Topics Concern  . Not on file  Social History Narrative   Patient lives at home with his roommate.   Caffeine Use: Yes   Family History    Problem Relation Age of Onset  . Hypertension Father   . Diabetes Sister   . High blood pressure Sister   . High blood pressure Sister   . Stroke Neg Hx   . Heart attack Neg Hx      OBJECTIVE:  Vitals:   11/20/17 1511  BP: (!) 142/90  Pulse: (!) 102  Resp: 18  Temp: 98.3 F (36.8 C)  TempSrc: Oral  SpO2: 97%     General appearance: AOx3 in no acute distress; nontoxic appearance HEENT: NCAT Ears: EACs clear; RT TM pearly gray with visible cone of light, LT TM erythematous and mildly injected.  Eyes: PERRL.  EOM grossly intact.  Mild maxillary sinus tenderness; mild clear rhinorrhea with boggy turbinates; tonsils mildly erythematous, uvula midline Neck: LAD Lungs: Diffuse wheezes and crackles heard throughout bilateral lung fields.  Mild improvement following breathing treatment Heart: regular rate and rhythm.  Radial pulses 2+ symmetrical bilaterally Skin: warm and dry; eczematous rash right flexural area of elbow Psychological: alert and cooperative; normal mood and affect   DIAGNOSTIC STUDIES:   CLINICAL DATA: Back pain starting 4 days ago, shortness of breath, cough, sore throat. Smoker.  EXAM: CHEST - 2 VIEW  COMPARISON: Chest x-rays dated 07/29/2017 05/08/2016.  FINDINGS: Heart size and mediastinal contours are within normal limits. Chronic bronchitic changes centrally. Mild biapical pleuroparenchymal scarring. Lungs otherwise clear. No confluent opacity to suggest a developing pneumonia. No pleural effusion or pneumothorax seen. No acute or suspicious osseous finding.  IMPRESSION: 1. No active cardiopulmonary disease. No evidence of pneumonia or pulmonary edema. 2. Chronic bronchitic changes.   Electronically Signed By: Bary Richard M.D. On: 11/20/2017 16:21  I have reviewed the x-rays myself and the radiologist interpretation. I am in agreement with the radiologist interpretation.    ASSESSMENT & PLAN:  No diagnosis found.  No orders of  the defined types were placed in this encounter.  Chest x-ray did not show signs of pneumonia, just chronic bronchitis Breathing treatment given in office Rest and push fluids Prednisone prescribed Azithromycin prescribed Hydrocodone-Homatropine syrup prescribed.  Take as needed for cough Take medications as directed and to completion Follow up with PCP as needed if symptoms persists Return or go to the ED if you have any new or worsening symptoms  Use kenalog twice daily on rash, until symptoms resolve  Reviewed expectations re: course of current medical issues. Questions answered. Outlined signs and symptoms indicating need for more acute intervention. Patient verbalized understanding. After Visit Summary given.  Rennis Harding, PA-C 11/20/17 1652

## 2017-12-06 ENCOUNTER — Encounter (HOSPITAL_COMMUNITY): Payer: Self-pay | Admitting: Emergency Medicine

## 2017-12-06 ENCOUNTER — Ambulatory Visit (HOSPITAL_COMMUNITY)
Admission: EM | Admit: 2017-12-06 | Discharge: 2017-12-06 | Disposition: A | Discharge status: Home / Self Care | Payer: Self-pay | Attending: Family Medicine | Admitting: Family Medicine

## 2017-12-06 DIAGNOSIS — R059 Cough, unspecified: Secondary | ICD-10-CM

## 2017-12-06 DIAGNOSIS — R05 Cough: Secondary | ICD-10-CM

## 2017-12-06 MED ORDER — PREDNISONE 50 MG PO TABS: 50.0000 mg | ORAL_TABLET | Freq: Every day | ORAL | 0 refills | Status: AC

## 2017-12-06 MED ORDER — ALBUTEROL SULFATE HFA 108 (90 BASE) MCG/ACT IN AERS: 2.0000 | INHALATION_SPRAY | RESPIRATORY_TRACT | 0 refills | Status: AC | PRN

## 2017-12-06 MED ORDER — HYDROCOD POLST-CPM POLST ER 10-8 MG/5ML PO SUER: 5.0000 mL | Freq: Every evening | ORAL | 0 refills | Status: DC | PRN

## 2017-12-06 NOTE — Discharge Instructions (Signed)
Prednisone as directed.  I have refilled Tussionex for short course to help with the cough.  Please follow-up with your ENT as scheduled for further evaluation of this cough.  As discussed, decreasing smoking will help with the symptoms as well.

## 2017-12-06 NOTE — ED Triage Notes (Signed)
Was seen here two weeks ago for bronchitis, given tussinex and prednisone, states it went away but now tis back once the medication ran out.

## 2017-12-06 NOTE — ED Provider Notes (Signed)
[facility]    CSN: [id number] Arrival date & time: 12/06/17  1530     History   Chief Complaint Chief Complaint  Patient presents with  . Cough    HPI Daniel Valencia is a 52 y.o. male.   52 year old male with history of asthma, HLD, HTN, comes in for 1 week history of cough. States was treated for bronchitis 2 weeks ago with good relief tussionex and prednisone. Denies rhinorrhea, nasal congestion. 3 days ago with subjective fever after coughing. Has been told this could be due to acid reflux in the past, and is seeing an ENT in a few days for further evaluation needed.  He continues to be a current everyday smoker.  Does have some shortness of breath while coughing fits happens.  No obvious wheezing.      Past Medical History:  Diagnosis Date  . Anxiety 12/30/2011  . Asthma   . DDD (degenerative disc disease)   . High cholesterol   . Hyperlipidemia   . Hypertension   . Unspecified asthma(493.90)     Patient Active Problem List   Diagnosis Date Noted  . Left arm numbness 12/14/2013  . TIA (transient ischemic attack) 11/18/2013  . Ingrown nail 07/07/2013  . Pain, joint, foot, right 07/07/2013  . Bipolar affective disorder (HCC) 02/21/2012  . Allergic rhinitis, cause unspecified 02/21/2012  . Concentration deficit 02/21/2012  . Anxiety disorder 12/30/2011  . Chronic lower back pain 11/12/2011  . Hyperlipidemia   . Routine general medical examination at a health care facility 11/09/2011  . Unspecified asthma(493.90)   . Hypertension     Past Surgical History:  Procedure Laterality Date  . CHOLECYSTECTOMY    . right foot surgury  apirl 2013   [facility]  . right leg surgury     pin in right leg      Home Medications    Prior to Admission medications   Medication Sig Start Date End Date Taking? Authorizing Provider  albuterol (PROVENTIL HFA;VENTOLIN HFA) 108 (90 Base) MCG/ACT inhaler Inhale 2 puffs into the lungs every 4 (four) hours as needed  for wheezing or shortness of breath. 12/06/17   Daniel Schnieders V, PA-C  AMLODIPINE BESYLATE PO Take by mouth.    [provider]  azithromycin (ZITHROMAX) 250 MG tablet Take 1 tablet (250 mg total) by mouth daily. Take first 2 tablets together, then 1 every day until finished. 11/20/17   Wurst, [place], PA-C  benzonatate (TESSALON) 100 MG capsule Take 1 capsule (100 mg total) by mouth every 8 (eight) hours. 07/08/17   Cathie Hoops, Egidio Lofgren V, PA-C  cetirizine-pseudoephedrine (ZYRTEC-D) 5-120 MG tablet Take 1 tablet by mouth daily. 07/08/17   Cathie Hoops, Clorene Nerio V, PA-C  chlorpheniramine-HYDROcodone (TUSSIONEX PENNKINETIC ER) 10-8 MG/5ML SUER Take 5 mLs by mouth at bedtime as needed for cough. 12/06/17   Cathie Hoops, Donielle Radziewicz V, PA-C  fluticasone (FLONASE) 50 MCG/ACT nasal spray Place 2 sprays into both nostrils daily. 07/08/17   Cathie Hoops, Grettell Ransdell V, PA-C  lidocaine (XYLOCAINE) 2 % solution Use as directed 15 mLs in the mouth or throat as needed for mouth pain. 07/08/17   Aurel Nguyen V, PA-C  LISINOPRIL PO Take by mouth.    [provider]  metoprolol tartrate (LOPRESSOR) 25 MG tablet Take 0.5 tablets (12.5 mg total) by mouth daily as needed. 07/29/17   Charlynne Pander, MD  multivitamin (ONE-A-DAY MEN'S) TABS Take 1 tablet by mouth daily.    [provider]  predniSONE (DELTASONE)  50 MG tablet Take 1 tablet (50 mg total) by mouth daily for 5 days. 12/06/17 12/11/17  Cathie HoopsYu, Rontae Inglett V, PA-C  triamcinolone ointment (KENALOG) 0.5 % Apply 1 application topically 2 (two) times daily. 11/20/17   Rennis HardingWurst, Brittany, PA-C    Family History Family History  Problem Relation Age of Onset  . Hypertension Father   . Diabetes Sister   . High blood pressure Sister   . High blood pressure Sister   . Stroke Neg Hx   . Heart attack Neg Hx     Social History Social History   Tobacco Use  . Smoking status: Current Some Day Smoker    Packs/day: 1.00    Years: 32.00    Pack years: 32.00    Types: Cigarettes  . Smokeless tobacco: Never Used  Substance Use Topics   . Alcohol use: No    Frequency: Never  . Drug use: No     Allergies   Peanuts [peanut oil]   Review of Systems Review of Systems  Reason unable to perform ROS: See HPI as above.     Physical Exam Triage Vital Signs ED Triage Vitals [12/06/17 1652]  Enc Vitals Group     BP (!) 152/99     Pulse Rate (!) 118     Resp 18     Temp 98.8 F (37.1 C)     Temp src      SpO2 97 %     Weight      Height      Head Circumference      Peak Flow      Pain Score      Pain Loc      Pain Edu?      Excl. in GC?    No data found.  Updated Vital Signs BP (!) 152/99   Pulse (!) 118   Temp 98.8 F (37.1 C)   Resp 18   SpO2 97%   Physical Exam  Constitutional: He is oriented to person, place, and time. He appears well-developed and well-nourished. No distress.  HENT:  Head: Normocephalic and atraumatic.  Right Ear: Tympanic membrane, external ear and ear canal normal. Tympanic membrane is not erythematous and not bulging.  Left Ear: Tympanic membrane, external ear and ear canal normal. Tympanic membrane is not erythematous and not bulging.  Nose: Nose normal. Right sinus exhibits no maxillary sinus tenderness and no frontal sinus tenderness. Left sinus exhibits no maxillary sinus tenderness and no frontal sinus tenderness.  Mouth/Throat: Uvula is midline and mucous membranes are normal. Posterior oropharyngeal erythema present. No tonsillar exudate.  Eyes: Pupils are equal, round, and reactive to light. Conjunctivae are normal.  Neck: Normal range of motion. Neck supple.  Cardiovascular: Normal rate, regular rhythm and normal heart sounds. Exam reveals no gallop and no friction rub.  No murmur heard. Pulse rate on recheck 72.  Pulmonary/Chest: Effort normal and breath sounds normal. He has no decreased breath sounds. He has no wheezes. He has no rhonchi. He has no rales.  Lymphadenopathy:    He has no cervical adenopathy.  Neurological: He is alert and oriented to person,  place, and time.  Skin: Skin is warm and dry.  Psychiatric: He has a normal mood and affect. His behavior is normal. Judgment normal.     UC Treatments / Results  Labs (all labs ordered are listed, but only abnormal results are displayed) Labs Reviewed - No data to display  EKG None  Radiology No results  found.  Procedures Procedures (including critical care time)  Medications Ordered in UC Medications - No data to display  Initial Impression / Assessment and Plan / UC Course  I have reviewed the triage vital signs and the nursing notes.  Pertinent labs & imaging results that were available during my care of the patient were reviewed by me and considered in my medical decision making (see chart for details).    Lungs clear to auscultation bilaterally without adventitious lung sounds.  Patient is nontoxic in appearance, speaking in full sentences without problems, though with occasional coughing fits.  Discussed  possible chronic bronchitis given continued smoking history.  Patient is also being evaluated by ENT and PCP due to chronic cough, and has an appointment with ENT in 4 days.  Will provide prednisone and Tussionex to help with symptoms and have patient follow-up as scheduled for further evaluation and management needed.  Return precautions given.  Smoking cessation discussed.  Patient expresses understanding and agrees to plan.  Final Clinical Impressions(s) / UC Diagnoses   Final diagnoses:  Cough    ED Prescriptions    Medication Sig Dispense Auth. Provider   chlorpheniramine-HYDROcodone (TUSSIONEX PENNKINETIC ER) 10-8 MG/5ML SUER Take 5 mLs by mouth at bedtime as needed for cough. 60 mL Safina Huard V, PA-C   predniSONE (DELTASONE) 50 MG tablet Take 1 tablet (50 mg total) by mouth daily for 5 days. 5 tablet Dmitri Pettigrew V, PA-C   albuterol (PROVENTIL HFA;VENTOLIN HFA) 108 (90 Base) MCG/ACT inhaler Inhale 2 puffs into the lungs every 4 (four) hours as needed for wheezing or  shortness of breath. 1 Inhaler Belinda Fisher, PA-C     Controlled Substance Prescriptions [place] Controlled Substance Registry consulted? Yes, I have consulted the [place] Controlled Substances Registry for this patient, and feel the risk/benefit ratio today is favorable for proceeding with this prescription for a controlled substance.   Belinda Fisher, PA-C 12/06/17 1758

## 2018-04-08 ENCOUNTER — Other Ambulatory Visit: Payer: Self-pay

## 2018-04-08 ENCOUNTER — Encounter (HOSPITAL_COMMUNITY): Payer: Self-pay | Admitting: Family Medicine

## 2018-04-08 ENCOUNTER — Ambulatory Visit (HOSPITAL_COMMUNITY)
Admission: EM | Admit: 2018-04-08 | Discharge: 2018-04-08 | Disposition: A | Discharge status: Home / Self Care | Payer: Self-pay | Attending: Family Medicine | Admitting: Family Medicine

## 2018-04-08 DIAGNOSIS — B9789 Other viral agents as the cause of diseases classified elsewhere: Secondary | ICD-10-CM

## 2018-04-08 DIAGNOSIS — J069 Acute upper respiratory infection, unspecified: Secondary | ICD-10-CM

## 2018-04-08 DIAGNOSIS — K047 Periapical abscess without sinus: Secondary | ICD-10-CM

## 2018-04-08 DIAGNOSIS — K0889 Other specified disorders of teeth and supporting structures: Secondary | ICD-10-CM

## 2018-04-08 MED ORDER — AMOXICILLIN-POT CLAVULANATE 875-125 MG PO TABS: 1.0000 | ORAL_TABLET | Freq: Two times a day (BID) | ORAL | 0 refills | Status: AC

## 2018-04-08 MED ORDER — BENZONATATE 100 MG PO CAPS: 100.0000 mg | ORAL_CAPSULE | Freq: Three times a day (TID) | ORAL | 0 refills | Status: DC

## 2018-04-08 MED ORDER — HYDROCODONE-ACETAMINOPHEN 5-325 MG PO TABS: 1.0000 | ORAL_TABLET | Freq: Every evening | ORAL | 0 refills | Status: DC | PRN

## 2018-04-08 NOTE — Discharge Instructions (Signed)
Cough: Get plenty of rest and push fluids Prescribed tessolone perles as needed for cough Follow up with PCP if symptoms persist Return or go to ER if you have any new or worsening symptoms   Dental pain: Augmentin prescribed.  Take as directed and to completion Continue with motrin during the day as needed for mild or moderate pain.  Norco prescribed for severe or break-through pain.  This medication can make you drowsy so do not take prior to driving or operating heavy machinery Recommend a soft diet  Maintain proper dental hygiene Follow up with dentist as soon as possible for further evaluation and treatment

## 2018-04-08 NOTE — ED Triage Notes (Signed)
Pt cc dental pain and deep cough. X 4 days.

## 2018-04-08 NOTE — ED Provider Notes (Signed)
[facility]   [id number] 04/08/18 Arrival Time: 1512  CC: COUGH AND DENTAL PAIN  SUBJECTIVE:  Daniel Valencia is a 52 y.o. male who presents with cough x 4 weeks.  Denies positive sick exposure or precipitating event.  Describes cough as intermittent and dry.  Has tried OTC medications without relief.  Symptoms are made worse at night.  Reports previous symptoms in the past. Complains of associated rhinorrhea.  Denies fever, chills, fatigue, sinus pain, rhinorrhea, sore throat, SOB, wheezing, chest pain, nausea, changes in bowel or bladder habits.    Also mentions dental pain x 4 days.  Localizes pain to left lower sided.  Denies precipitating event. Worse with chewing and at night.  Has appointment with dentist in 3 weeks.  Has tried motrin without relief.  Denies tongue, lip or mouth swelling or difficulty swallowing.    ROS: As per HPI.  Past Medical History:  Diagnosis Date  . Anxiety 12/30/2011  . Asthma   . DDD (degenerative disc disease)   . High cholesterol   . Hyperlipidemia   . Hypertension   . Unspecified asthma(493.90)    Past Surgical History:  Procedure Laterality Date  . CHOLECYSTECTOMY    . right foot surgury  apirl 2013   [facility]  . right leg surgury     pin in right leg   Allergies  Allergen Reactions  . Peanuts [Peanut Oil] Anaphylaxis   No current facility-administered medications on file prior to encounter.    Current Outpatient Medications on File Prior to Encounter  Medication Sig Dispense Refill  . albuterol (PROVENTIL HFA;VENTOLIN HFA) 108 (90 Base) MCG/ACT inhaler Inhale 2 puffs into the lungs every 4 (four) hours as needed for wheezing or shortness of breath. 1 Inhaler 0  . AMLODIPINE BESYLATE PO Take by mouth.    . cetirizine-pseudoephedrine (ZYRTEC-D) 5-120 MG tablet Take 1 tablet by mouth daily. 15 tablet 0  . fluticasone (FLONASE) 50 MCG/ACT nasal spray Place 2 sprays into both nostrils daily. 1 g 0  . lidocaine (XYLOCAINE) 2 %  solution Use as directed 15 mLs in the mouth or throat as needed for mouth pain. 100 mL 0  . LISINOPRIL PO Take by mouth.    . metoprolol tartrate (LOPRESSOR) 25 MG tablet Take 0.5 tablets (12.5 mg total) by mouth daily as needed. 20 tablet 0  . multivitamin (ONE-A-DAY MEN'S) TABS Take 1 tablet by mouth daily.    Marland Kitchen triamcinolone ointment (KENALOG) 0.5 % Apply 1 application topically 2 (two) times daily. 30 g 0    Social History   Socioeconomic History  . Marital status: Single    Spouse name: Not on file  . Number of children: 0  . Years of education: HS  . Highest education level: Not on file  Occupational History  . Occupation: vice Teacher, English as a foreign language: [employer]  Social Needs  . Financial resource strain: Not on file  . Food insecurity:    Worry: Not on file    Inability: Not on file  . Transportation needs:    Medical: Not on file    Non-medical: Not on file  Tobacco Use  . Smoking status: Current Some Day Smoker    Packs/day: 1.00    Years: 32.00    Pack years: 32.00    Types: Cigarettes  . Smokeless tobacco: Never Used  Substance and Sexual Activity  . Alcohol use: No    Frequency: Never  . Drug use: No  .  Sexual activity: Not on file  Lifestyle  . Physical activity:    Days per week: Not on file    Minutes per session: Not on file  . Stress: Not on file  Relationships  . Social connections:    Talks on phone: Not on file    Gets together: Not on file    Attends religious service: Not on file    Active member of club or organization: Not on file    Attends meetings of clubs or organizations: Not on file    Relationship status: Not on file  . Intimate partner violence:    Fear of current or ex partner: Not on file    Emotionally abused: Not on file    Physically abused: Not on file    Forced sexual activity: Not on file  Other Topics Concern  . Not on file  Social History Narrative   Patient lives at home with his roommate.    Caffeine Use: Yes   Family History  Problem Relation Age of Onset  . Hypertension Father   . Diabetes Sister   . High blood pressure Sister   . High blood pressure Sister   . Stroke Neg Hx   . Heart attack Neg Hx      OBJECTIVE:  Vitals:   04/08/18 1630 04/08/18 1632  BP: (!) 141/85   Pulse: 99   Resp: 18   Temp: 97.8 F (36.6 C)   TempSrc: Oral   SpO2: 100%   Weight:  180 lb (81.6 kg)     General appearance: AOx3 in no acute distress HEENT: NCAT; PERRL.  EOM grossly intact.  no clear rhinorrhea; Throat: tonsils nonerythematous, uvula midline; Mouth: dentition overall fair with obvious dental caries left lower back molar, mild surrounding gingival swelling and erythema Neck: supple without LAD Lungs: clear to auscultation bilaterally without adventitious breath sounds; absent cough Heart: regular rate and rhythm.  Radial pulses 2+ symmetrical bilaterally Skin: warm and dry Psychological: alert and cooperative; normal mood and affect   ASSESSMENT & PLAN:  1. Viral URI with cough   2. Pain, dental   3. Dental infection     Meds ordered this encounter  Medications  . amoxicillin-clavulanate (AUGMENTIN) 875-125 MG tablet    Sig: Take 1 tablet by mouth every 12 (twelve) hours for 10 days.    Dispense:  20 tablet    Refill:  0    Order Specific Question:   Supervising Provider    Answer:   Isa Rankin [phone]  . benzonatate (TESSALON) 100 MG capsule    Sig: Take 1 capsule (100 mg total) by mouth every 8 (eight) hours.    Dispense:  21 capsule    Refill:  0    Order Specific Question:   Supervising Provider    Answer:   Isa Rankin [phone]  . HYDROcodone-acetaminophen (NORCO/VICODIN) 5-325 MG tablet    Sig: Take 1-2 tablets by mouth at bedtime as needed for severe pain.    Dispense:  10 tablet    Refill:  0    Order Specific Question:   Supervising Provider    Answer:   Isa Rankin [[id number]]   Cough: Get plenty of rest and push  fluids Prescribed tessolone perles as needed for cough Follow up with PCP if symptoms persist Return or go to ER if you have any new or worsening symptoms   Dental pain: Augmentin prescribed.  Take as directed and to completion Continue with motrin  during the day as needed for mild or moderate pain.  Norco prescribed for severe or break-through pain.  This medication can make you drowsy so do not take prior to driving or operating heavy machinery Recommend a soft diet  Maintain proper dental hygiene Follow up with dentist as soon as possible for further evaluation and treatment    Reviewed expectations re: course of current medical issues. Questions answered. Outlined signs and symptoms indicating need for more acute intervention. Patient verbalized understanding. After Visit Summary given.          Rennis Harding, PA-C 04/08/18 1742

## 2018-05-13 ENCOUNTER — Encounter (HOSPITAL_BASED_OUTPATIENT_CLINIC_OR_DEPARTMENT_OTHER): Payer: Self-pay | Admitting: *Deleted

## 2018-05-13 ENCOUNTER — Emergency Department (HOSPITAL_BASED_OUTPATIENT_CLINIC_OR_DEPARTMENT_OTHER): Payer: Self-pay

## 2018-05-13 ENCOUNTER — Other Ambulatory Visit: Payer: Self-pay

## 2018-05-13 ENCOUNTER — Emergency Department (HOSPITAL_BASED_OUTPATIENT_CLINIC_OR_DEPARTMENT_OTHER)
Admission: EM | Admit: 2018-05-13 | Discharge: 2018-05-13 | Disposition: A | Discharge status: Home / Self Care | Payer: Self-pay | Attending: Emergency Medicine | Admitting: Emergency Medicine

## 2018-05-13 DIAGNOSIS — I10 Essential (primary) hypertension: Secondary | ICD-10-CM | POA: Insufficient documentation

## 2018-05-13 DIAGNOSIS — R51 Headache: Secondary | ICD-10-CM

## 2018-05-13 DIAGNOSIS — R1013 Epigastric pain: Secondary | ICD-10-CM | POA: Insufficient documentation

## 2018-05-13 DIAGNOSIS — Z79899 Other long term (current) drug therapy: Secondary | ICD-10-CM | POA: Insufficient documentation

## 2018-05-13 DIAGNOSIS — Z91010 Allergy to peanuts: Secondary | ICD-10-CM | POA: Insufficient documentation

## 2018-05-13 DIAGNOSIS — R519 Headache, unspecified: Secondary | ICD-10-CM

## 2018-05-13 DIAGNOSIS — Z8673 Personal history of transient ischemic attack (TIA), and cerebral infarction without residual deficits: Secondary | ICD-10-CM | POA: Insufficient documentation

## 2018-05-13 DIAGNOSIS — F419 Anxiety disorder, unspecified: Secondary | ICD-10-CM | POA: Insufficient documentation

## 2018-05-13 DIAGNOSIS — Z9049 Acquired absence of other specified parts of digestive tract: Secondary | ICD-10-CM | POA: Insufficient documentation

## 2018-05-13 DIAGNOSIS — F17210 Nicotine dependence, cigarettes, uncomplicated: Secondary | ICD-10-CM | POA: Insufficient documentation

## 2018-05-13 DIAGNOSIS — J45909 Unspecified asthma, uncomplicated: Secondary | ICD-10-CM | POA: Insufficient documentation

## 2018-05-13 DIAGNOSIS — F319 Bipolar disorder, unspecified: Secondary | ICD-10-CM | POA: Insufficient documentation

## 2018-05-13 LAB — CBC WITH DIFFERENTIAL/PLATELET
Abs Immature Granulocytes: 0.02 10*3/uL (ref 0.00–0.07)
Basophils Absolute: 0.1 10*3/uL (ref 0.0–0.1)
Basophils Relative: 1 %
EOS ABS: 0.6 10*3/uL — AB (ref 0.0–0.5)
EOS PCT: 8 %
HEMATOCRIT: 42.6 % (ref 39.0–52.0)
Hemoglobin: 14.7 g/dL (ref 13.0–17.0)
Immature Granulocytes: 0 %
Lymphocytes Relative: 50 %
Lymphs Abs: 3.9 10*3/uL (ref 0.7–4.0)
MCH: 31.1 pg (ref 26.0–34.0)
MCHC: 34.5 g/dL (ref 30.0–36.0)
MCV: 90.3 fL (ref 80.0–100.0)
MONOS PCT: 10 %
Monocytes Absolute: 0.8 10*3/uL (ref 0.1–1.0)
NRBC: 0 % (ref 0.0–0.2)
Neutro Abs: 2.3 10*3/uL (ref 1.7–7.7)
Neutrophils Relative %: 31 %
PLATELETS: 324 10*3/uL (ref 150–400)
RBC: 4.72 MIL/uL (ref 4.22–5.81)
RDW: 13.2 % (ref 11.5–15.5)
WBC: 7.7 10*3/uL (ref 4.0–10.5)

## 2018-05-13 LAB — LIPASE, BLOOD: LIPASE: 30 U/L (ref 11–51)

## 2018-05-13 LAB — COMPREHENSIVE METABOLIC PANEL
ALT: 31 U/L (ref 0–44)
AST: 20 U/L (ref 15–41)
Albumin: 4 g/dL (ref 3.5–5.0)
Alkaline Phosphatase: 89 U/L (ref 38–126)
Anion gap: 11 (ref 5–15)
BILIRUBIN TOTAL: 0.3 mg/dL (ref 0.3–1.2)
BUN: 21 mg/dL — ABNORMAL HIGH (ref 6–20)
CHLORIDE: 101 mmol/L (ref 98–111)
CO2: 23 mmol/L (ref 22–32)
Calcium: 8.9 mg/dL (ref 8.9–10.3)
Creatinine, Ser: 0.91 mg/dL (ref 0.61–1.24)
Glucose, Bld: 109 mg/dL — ABNORMAL HIGH (ref 70–99)
POTASSIUM: 3.1 mmol/L — AB (ref 3.5–5.1)
Sodium: 135 mmol/L (ref 135–145)
Total Protein: 7.5 g/dL (ref 6.5–8.1)

## 2018-05-13 LAB — TROPONIN I

## 2018-05-13 MED ORDER — POTASSIUM CHLORIDE CRYS ER 20 MEQ PO TBCR
40.0000 meq | EXTENDED_RELEASE_TABLET | Freq: Once | ORAL | Status: AC
  Administered 2018-05-13: 40 meq via ORAL
  Filled 2018-05-13: qty 2

## 2018-05-13 MED ORDER — OMEPRAZOLE 20 MG PO CPDR: 20.0000 mg | DELAYED_RELEASE_CAPSULE | Freq: Every day | ORAL | 0 refills | Status: DC

## 2018-05-13 NOTE — ED Triage Notes (Addendum)
Rt side head discomfort  Denies n/v  No blurred vision  At time rt arm "feels funny" some burning in abd  Last 15-20 seconds at a time    Pt talked to doctor on demand  Who gave him dicyclomine for abd pain

## 2018-05-13 NOTE — Discharge Instructions (Addendum)
Follow-up with your doctor as well as the neurologist.  Reduce your [facility] powder use as well as other NSAID use.  Take some medication as prescribed.  Avoid alcohol, caffeine, spicy foods.  Return to the ED with new or worsening symptoms.

## 2018-05-13 NOTE — ED Provider Notes (Signed)
[facility] Provider Note   CSN: [id number] Arrival date & time: 05/13/18  0407     History   Chief Complaint Chief Complaint  Patient presents with  . Headache    HPI Daniel Valencia is a 52 y.o. male.  Patient reports intermittent burning sensation of the right side of his head for the past 2 weeks that comes and goes.  Endorses gradual onset headache.  No nausea, vomiting, diaphoresis.  No vision changes.  No focal weakness.  Today he became concerned because he had some tingling in his thumb and third finger on his right side.  This lasted for a few seconds and is now resolved.  No difficulty speaking or difficulty swallowing.  No weakness, numbness or tingling in his legs. Patient admits to taking a lot of BC powder for this headache and is also complaining of some intermittent burning sensation in his abdomen this been going on for several weeks as well.  No nausea or vomiting.  No blood in the stool.  No chest pain or shortness of breath.  Patient with history of hypertension, hyperlipidemia, asthma.  He was admitted in 2015 for suspected TIA with left arm numbness but had a negative work-up including MRIs of brain and C-spine.  The history is provided by the patient.    Past Medical History:  Diagnosis Date  . Anxiety 12/30/2011  . Asthma   . DDD (degenerative disc disease)   . High cholesterol   . Hyperlipidemia   . Hypertension   . Unspecified asthma(493.90)     Patient Active Problem List   Diagnosis Date Noted  . Left arm numbness 12/14/2013  . TIA (transient ischemic attack) 11/18/2013  . Ingrown nail 07/07/2013  . Pain, joint, foot, right 07/07/2013  . Bipolar affective disorder (HCC) 02/21/2012  . Allergic rhinitis, cause unspecified 02/21/2012  . Concentration deficit 02/21/2012  . Anxiety disorder 12/30/2011  . Chronic lower back pain 11/12/2011  . Hyperlipidemia   . Routine general medical examination at a health care  facility 11/09/2011  . Unspecified asthma(493.90)   . Hypertension     Past Surgical History:  Procedure Laterality Date  . CHOLECYSTECTOMY    . right foot surgury  apirl 2013   [facility]  . right leg surgury     pin in right leg        Home Medications    Prior to Admission medications   Medication Sig Start Date End Date Taking? Authorizing Provider  albuterol (PROVENTIL HFA;VENTOLIN HFA) 108 (90 Base) MCG/ACT inhaler Inhale 2 puffs into the lungs every 4 (four) hours as needed for wheezing or shortness of breath. 12/06/17   Yu, Amy V, PA-C  AMLODIPINE BESYLATE PO Take by mouth.    [provider]  benzonatate (TESSALON) 100 MG capsule Take 1 capsule (100 mg total) by mouth every 8 (eight) hours. 04/08/18   Wurst, [place], PA-C  cetirizine-pseudoephedrine (ZYRTEC-D) 5-120 MG tablet Take 1 tablet by mouth daily. 07/08/17   Cathie Hoops, Amy V, PA-C  fluticasone (FLONASE) 50 MCG/ACT nasal spray Place 2 sprays into both nostrils daily. 07/08/17   Cathie Hoops, Amy V, PA-C  HYDROcodone-acetaminophen (NORCO/VICODIN) 5-325 MG tablet Take 1-2 tablets by mouth at bedtime as needed for severe pain. 04/08/18   Wurst, [place], PA-C  lidocaine (XYLOCAINE) 2 % solution Use as directed 15 mLs in the mouth or throat as needed for mouth pain. 07/08/17   Yu, Amy V, PA-C  LISINOPRIL PO Take by mouth.  [provider]  metoprolol tartrate (LOPRESSOR) 25 MG tablet Take 0.5 tablets (12.5 mg total) by mouth daily as needed. 07/29/17   Charlynne Pander, MD  multivitamin (ONE-A-DAY MEN'S) TABS Take 1 tablet by mouth daily.    [provider]  triamcinolone ointment (KENALOG) 0.5 % Apply 1 application topically 2 (two) times daily. 11/20/17   Rennis Harding, PA-C    Family History Family History  Problem Relation Age of Onset  . Hypertension Father   . Diabetes Sister   . High blood pressure Sister   . High blood pressure Sister   . Stroke Neg Hx   . Heart attack Neg Hx     Social  History Social History   Tobacco Use  . Smoking status: Current Some Day Smoker    Packs/day: 1.00    Years: 32.00    Pack years: 32.00    Types: Cigarettes  . Smokeless tobacco: Never Used  Substance Use Topics  . Alcohol use: No    Frequency: Never  . Drug use: No     Allergies   Peanuts [peanut oil]   Review of Systems Review of Systems  Constitutional: Negative for activity change, appetite change and fever.  HENT: Negative for congestion and rhinorrhea.   Eyes: Negative for visual disturbance.  Respiratory: Negative for chest tightness.   Cardiovascular: Negative for chest pain.  Gastrointestinal: Negative for abdominal pain, nausea and vomiting.  Genitourinary: Negative for dysuria, hematuria and testicular pain.  Musculoskeletal: Negative for arthralgias, back pain and myalgias.  Neurological: Positive for numbness and headaches. Negative for dizziness, speech difficulty, weakness and light-headedness.   all other systems are negative except as noted in the HPI and PMH.     Physical Exam Updated Vital Signs BP (!) 142/90 (BP Location: Left Arm)   Pulse (!) 108   Temp 98.3 F (36.8 C) (Oral)   Resp 16   Ht 5\' 9"  (1.753 m)   Wt 95 kg   SpO2 98%   BMI 30.93 kg/m   Physical Exam  Constitutional: He is oriented to person, place, and time. He appears well-developed and well-nourished. No distress.  HENT:  Head: Normocephalic and atraumatic.  Mouth/Throat: Oropharynx is clear and moist. No oropharyngeal exudate.  Eyes: Pupils are equal, round, and reactive to light. Conjunctivae and EOM are normal.  Neck: Normal range of motion. Neck supple.  No meningismus.  Cardiovascular: Normal rate, regular rhythm, normal heart sounds and intact distal pulses.  No murmur heard. Pulmonary/Chest: Effort normal and breath sounds normal. No respiratory distress.  Abdominal: Soft. There is tenderness. There is no rebound and no guarding.  Mild epigastric tenderness   Musculoskeletal: Normal range of motion. He exhibits no edema or tenderness.  Neurological: He is alert and oriented to person, place, and time. No cranial nerve deficit. He exhibits normal muscle tone. Coordination normal.  No ataxia on finger to nose bilaterally. No pronator drift. 5/5 strength throughout. CN 2-12 intact.Equal grip strength. Sensation intact.   Skin: Skin is warm. Capillary refill takes less than 2 seconds.  Psychiatric: He has a normal mood and affect. His behavior is normal.  Nursing note and vitals reviewed.    ED Treatments / Results  Labs (all labs ordered are listed, but only abnormal results are displayed) Labs Reviewed  CBC WITH DIFFERENTIAL/PLATELET - Abnormal; Notable for the following components:      Result Value   Eosinophils Absolute 0.6 (*)    All other components within normal limits  COMPREHENSIVE METABOLIC PANEL - Abnormal; Notable for the following components:   Potassium 3.1 (*)    Glucose, Bld 109 (*)    BUN 21 (*)    All other components within normal limits  LIPASE, BLOOD  TROPONIN I    EKG EKG Interpretation  Date/Time:  Tuesday May 13 2018 04:24:11 EST Ventricular Rate:  101 PR Interval:    QRS Duration: 102 QT Interval:  339 QTC Calculation: 440 R Axis:   34 Text Interpretation:  Sinus tachycardia Atrial premature complexes Probable left atrial enlargement No significant change was found Confirmed by Glynn Octave [phone]) on 05/13/2018 4:38:20 AM   Radiology Ct Head Wo Contrast  Result Date: 05/13/2018 CLINICAL DATA:  52 year old male with right-sided headache. No head injury. EXAM: CT HEAD WITHOUT CONTRAST TECHNIQUE: Contiguous axial images were obtained from the base of the skull through the vertex without intravenous contrast. COMPARISON:  Brain CT and MRI dated 11/18/2013 FINDINGS: Brain: The ventricles and sulci appropriate size for patient's age. The gray-white matter discrimination is preserved. There is no acute  intracranial hemorrhage. No mass effect or midline shift. No extra-axial fluid collection. Vascular: No hyperdense vessel or unexpected calcification. Skull: Normal. Negative for fracture or focal lesion. Sinuses/Orbits: There is mucoperiosteal thickening of paranasal sinuses. The mastoid air cells are clear. No air-fluid levels. Other: None IMPRESSION: Normal noncontrast CT of the brain. Electronically Signed   By: Elgie Collard M.D.   On: 05/13/2018 05:47   Dg Abdomen Acute W/chest  Result Date: 05/13/2018 CLINICAL DATA:  52 year old male with mid abdominal and epigastric pain. EXAM: DG ABDOMEN ACUTE W/ 1V CHEST COMPARISON:  Chest radiograph dated 11/20/2017 FINDINGS: Minimal diffuse chronic interstitial coarsening and bronchitic changes. Left apical subpleural scarring. No focal consolidation, pleural effusion, or pneumothorax. The cardiac silhouette is within normal limits. There is no bowel dilatation or evidence of obstruction. No free air or radiopaque calculi. Right upper quadrant cholecystectomy clips. Partially visualized right femoral neck fixation hardware. The osseous structures and soft tissues are grossly unremarkable. IMPRESSION: 1. No acute cardiopulmonary process. 2. No evidence of bowel obstruction. Electronically Signed   By: Elgie Collard M.D.   On: 05/13/2018 05:51    Procedures Procedures (including critical care time)  Medications Ordered in ED Medications - No data to display   Initial Impression / Assessment and Plan / ED Course  I have reviewed the triage vital signs and the nursing notes.  Pertinent labs & imaging results that were available during my care of the patient were reviewed by me and considered in my medical decision making (see chart for details).    2 weeks of intermittent right-sided headache.  None at this time.  Did have transient tingling in his fingertips for a few seconds which is since resolved.  Nonfocal neurological exam. Low suspicion  for SAH, meningitis, temporal arteritis.   CT head is negative.  Denies any headache at this time.  Suspect his abdominal pain is likely secondary to gastritis from his frequent BC powder use.  He has no peritoneal signs on exam.  Low suspicion for CVA or TIA.  Does have intermittent tingling to his thumb and third finger that lasted for a few seconds but is now resolved. D/w Dr. Amada Jupiter of neurology who agrees low suspicion for TIA.  Advise follow-up with PCP as well as neurology.  Will start PPI.  Advised to decrease BC powder use as well as other NSAIDs, caffeine, alcohol, spicy foods.  Return precautions discussed  Final Clinical  Impressions(s) / ED Diagnoses   Final diagnoses:  Epigastric pain  Headache, unspecified headache type    ED Discharge Orders    None       Hedi Barkan, Jeannett SeniorStephen, MD 05/13/18 [id number]

## 2018-12-21 ENCOUNTER — Emergency Department (HOSPITAL_BASED_OUTPATIENT_CLINIC_OR_DEPARTMENT_OTHER)
Admission: EM | Admit: 2018-12-21 | Discharge: 2018-12-21 | Disposition: A | Discharge status: Home / Self Care | Payer: Self-pay | Attending: Emergency Medicine | Admitting: Emergency Medicine

## 2018-12-21 ENCOUNTER — Other Ambulatory Visit: Payer: Self-pay

## 2018-12-21 ENCOUNTER — Encounter (HOSPITAL_BASED_OUTPATIENT_CLINIC_OR_DEPARTMENT_OTHER): Payer: Self-pay

## 2018-12-21 DIAGNOSIS — F17210 Nicotine dependence, cigarettes, uncomplicated: Secondary | ICD-10-CM | POA: Insufficient documentation

## 2018-12-21 DIAGNOSIS — I10 Essential (primary) hypertension: Secondary | ICD-10-CM | POA: Insufficient documentation

## 2018-12-21 DIAGNOSIS — Z79899 Other long term (current) drug therapy: Secondary | ICD-10-CM | POA: Insufficient documentation

## 2018-12-21 DIAGNOSIS — J45909 Unspecified asthma, uncomplicated: Secondary | ICD-10-CM | POA: Insufficient documentation

## 2018-12-21 DIAGNOSIS — F419 Anxiety disorder, unspecified: Secondary | ICD-10-CM | POA: Insufficient documentation

## 2018-12-21 DIAGNOSIS — Z91010 Allergy to peanuts: Secondary | ICD-10-CM | POA: Insufficient documentation

## 2018-12-21 MED ORDER — HYDROXYZINE HCL 25 MG PO TABS
25.0000 mg | ORAL_TABLET | Freq: Once | ORAL | Status: AC
  Administered 2018-12-21: 25 mg via ORAL
  Filled 2018-12-21: qty 1

## 2018-12-21 MED ORDER — HYDROXYZINE HCL 25 MG PO TABS: 25.0000 mg | ORAL_TABLET | Freq: Four times a day (QID) | ORAL | 0 refills | Status: DC | PRN

## 2018-12-21 NOTE — Discharge Instructions (Addendum)
You were seen today for possible anxiety attack.  Take medications as prescribed.  Follow-up with your primary physician if you have ongoing or worsening symptoms.  If you develop thoughts of wanting to hurt yourself or anyone else you need to be reevaluated.

## 2018-12-21 NOTE — ED Notes (Signed)
Patient states when he laid down to go to sleep earlier tonight he became very anxious and was having racing thoughts; he states "I think I might have had a panic attack."

## 2018-12-21 NOTE — ED Notes (Signed)
ED Provider at bedside. 

## 2018-12-21 NOTE — ED Provider Notes (Signed)
[facility] EMERGENCY DEPARTMENT Provider Note   CSN: [id number] Arrival date & time: 12/21/18  0147     History   Chief Complaint Chief Complaint  Patient presents with  . Panic Attack    HPI Daniel Valencia is a 53 y.o. male.     HPI  This is a 53 year old male with a history of anxiety, hypertension, hyperlipidemia who presents with possible panic attack.  Patient reports that Daniel Valencia was trying to go to sleep when Daniel Valencia developed racing thoughts and "fogginess."  Denies any palpitations or physical symptoms.  States that Daniel Valencia has previously had a panic attack and is unsure if maybe this was a panic attack as well.  Daniel Valencia does report some increased stressors.  Denies any suicidal or homicidal ideation.  Denies any chest pain, shortness of breath, palpitations.  Currently Daniel Valencia states Daniel Valencia feels somewhat better.  Daniel Valencia does not take anything regularly for anxiety.  Past Medical History:  Diagnosis Date  . Anxiety 12/30/2011  . Asthma   . DDD (degenerative disc disease)   . High cholesterol   . Hyperlipidemia   . Hypertension   . Unspecified asthma(493.90)     Patient Active Problem List   Diagnosis Date Noted  . Left arm numbness 12/14/2013  . TIA (transient ischemic attack) 11/18/2013  . Ingrown nail 07/07/2013  . Pain, joint, foot, right 07/07/2013  . Bipolar affective disorder (HCC) 02/21/2012  . Allergic rhinitis, cause unspecified 02/21/2012  . Concentration deficit 02/21/2012  . Anxiety disorder 12/30/2011  . Chronic lower back pain 11/12/2011  . Hyperlipidemia   . Routine general medical examination at a health care facility 11/09/2011  . Unspecified asthma(493.90)   . Hypertension     Past Surgical History:  Procedure Laterality Date  . CHOLECYSTECTOMY    . right foot surgury  apirl 2013   [facility]  . right leg surgury     pin in right leg        Home Medications    Prior to Admission medications   Medication Sig Start Date End Date Taking? Authorizing  Provider  albuterol (PROVENTIL HFA;VENTOLIN HFA) 108 (90 Base) MCG/ACT inhaler Inhale 2 puffs into the lungs every 4 (four) hours as needed for wheezing or shortness of breath. 12/06/17  Yes Yu, Amy V, PA-C  AMLODIPINE BESYLATE PO Take by mouth.   Yes [provider]  HYDROcodone-acetaminophen (NORCO/VICODIN) 5-325 MG tablet Take 1-2 tablets by mouth at bedtime as needed for severe pain. 04/08/18  Yes Wurst, [place], PA-C  losartan (COZAAR) 50 MG tablet Take 50 mg by mouth daily.   Yes [provider]  omeprazole (PRILOSEC) 20 MG capsule Take 1 capsule (20 mg total) by mouth daily. 05/13/18  Yes Rancour, Jeannett SeniorStephen, MD  benzonatate (TESSALON) 100 MG capsule Take 1 capsule (100 mg total) by mouth every 8 (eight) hours. 04/08/18   Wurst, [place], PA-C  cetirizine-pseudoephedrine (ZYRTEC-D) 5-120 MG tablet Take 1 tablet by mouth daily. 07/08/17   Cathie HoopsYu, Amy V, PA-C  fluticasone (FLONASE) 50 MCG/ACT nasal spray Place 2 sprays into both nostrils daily. 07/08/17   Cathie HoopsYu, Amy V, PA-C  hydrOXYzine (ATARAX/VISTARIL) 25 MG tablet Take 1 tablet (25 mg total) by mouth every 6 (six) hours as needed. 12/21/18   , Mayer Maskerourtney F, MD  lidocaine (XYLOCAINE) 2 % solution Use as directed 15 mLs in the mouth or throat as needed for mouth pain. 07/08/17   Yu, Amy V, PA-C  LISINOPRIL PO Take by mouth.  [provider]  metoprolol tartrate (LOPRESSOR) 25 MG tablet Take 0.5 tablets (12.5 mg total) by mouth daily as needed. 07/29/17   Charlynne PanderYao, David Hsienta, MD  multivitamin (ONE-A-DAY MEN'S) TABS Take 1 tablet by mouth daily.    [provider]  triamcinolone ointment (KENALOG) 0.5 % Apply 1 application topically 2 (two) times daily. 11/20/17   Rennis HardingWurst, Brittany, PA-C    Family History Family History  Problem Relation Age of Onset  . Hypertension Father   . Diabetes Sister   . High blood pressure Sister   . High blood pressure Sister   . Stroke Neg Hx   . Heart attack Neg Hx     Social History  Social History   Tobacco Use  . Smoking status: Current Every Day Smoker    Packs/day: 1.00    Years: 32.00    Pack years: 32.00    Types: Cigarettes  . Smokeless tobacco: Never Used  Substance Use Topics  . Alcohol use: No    Frequency: Never  . Drug use: No     Allergies   Peanuts [peanut oil]   Review of Systems Review of Systems  Constitutional: Negative for fever.  Respiratory: Negative for shortness of breath.   Cardiovascular: Negative for chest pain and palpitations.  Gastrointestinal: Negative for nausea and vomiting.  Psychiatric/Behavioral: Negative for sleep disturbance and suicidal ideas. The patient is nervous/anxious.   All other systems reviewed and are negative.    Physical Exam Updated Vital Signs BP (!) 155/95 (BP Location: Right Arm)   Pulse 85   Temp 98.6 F (37 C) (Oral)   Resp 20   Ht 1.727 m (5\' 8" )   Wt 91.6 kg   SpO2 100%   BMI 30.71 kg/m   Physical Exam Vitals signs and nursing note reviewed.  Constitutional:      Appearance: Daniel Valencia is well-developed. Daniel Valencia is not ill-appearing or diaphoretic.  HENT:     Head: Normocephalic and atraumatic.     Mouth/Throat:     Mouth: Mucous membranes are moist.  Eyes:     Pupils: Pupils are equal, round, and reactive to light.  Neck:     Musculoskeletal: Neck supple.  Cardiovascular:     Rate and Rhythm: Normal rate and regular rhythm.     Heart sounds: Normal heart sounds. No murmur.  Pulmonary:     Effort: Pulmonary effort is normal. No respiratory distress.     Breath sounds: Normal breath sounds. No wheezing.  Abdominal:     General: Bowel sounds are normal.     Palpations: Abdomen is soft.     Tenderness: There is no abdominal tenderness. There is no rebound.  Skin:    General: Skin is warm and dry.  Neurological:     Mental Status: Daniel Valencia is alert and oriented to person, place, and time.  Psychiatric:        Mood and Affect: Mood normal.     Comments: Calm and cooperative      ED  Treatments / Results  Labs (all labs ordered are listed, but only abnormal results are displayed) Labs Reviewed - No data to display  EKG None  Radiology No results found.  Procedures Procedures (including critical care time)  Medications Ordered in ED Medications  hydrOXYzine (ATARAX/VISTARIL) tablet 25 mg (25 mg Oral Given 12/21/18 0219)     Initial Impression / Assessment and Plan / ED Course  I have reviewed the triage vital signs and the nursing notes.  Pertinent  labs & imaging results that were available during my care of the patient were reviewed by me and considered in my medical decision making (see chart for details).        Patient presents with anxiety and possible panic attack.  Daniel Valencia is overall nontoxic-appearing and vital signs are reassuring.  Daniel Valencia reports that Daniel Valencia feels much better.  Daniel Valencia is calm and cooperative on exam.  Physical exam is benign.  Daniel Valencia denied any significant physical complaints.  Patient was given Atarax.  Do not feel Daniel Valencia needs further work-up at this time.  After history, exam, and medical workup I feel the patient has been appropriately medically screened and is safe for discharge home. Pertinent diagnoses were discussed with the patient. Patient was given return precautions.   Final Clinical Impressions(s) / ED Diagnoses   Final diagnoses:  Anxiety    ED Discharge Orders         Ordered    hydrOXYzine (ATARAX/VISTARIL) 25 MG tablet  Every 6 hours PRN     12/21/18 0221           Merryl Hacker, MD 12/21/18 [id number]

## 2018-12-23 ENCOUNTER — Other Ambulatory Visit: Payer: Self-pay

## 2018-12-23 ENCOUNTER — Emergency Department (HOSPITAL_COMMUNITY): Payer: Self-pay

## 2018-12-23 ENCOUNTER — Emergency Department (HOSPITAL_COMMUNITY)
Admission: EM | Admit: 2018-12-23 | Discharge: 2018-12-23 | Disposition: A | Discharge status: Home / Self Care | Payer: Self-pay | Attending: Emergency Medicine | Admitting: Emergency Medicine

## 2018-12-23 ENCOUNTER — Encounter (HOSPITAL_COMMUNITY): Payer: Self-pay | Admitting: *Deleted

## 2018-12-23 DIAGNOSIS — R079 Chest pain, unspecified: Secondary | ICD-10-CM | POA: Insufficient documentation

## 2018-12-23 DIAGNOSIS — R0789 Other chest pain: Secondary | ICD-10-CM

## 2018-12-23 DIAGNOSIS — E785 Hyperlipidemia, unspecified: Secondary | ICD-10-CM | POA: Insufficient documentation

## 2018-12-23 DIAGNOSIS — Z79899 Other long term (current) drug therapy: Secondary | ICD-10-CM | POA: Insufficient documentation

## 2018-12-23 DIAGNOSIS — F17210 Nicotine dependence, cigarettes, uncomplicated: Secondary | ICD-10-CM | POA: Insufficient documentation

## 2018-12-23 DIAGNOSIS — I10 Essential (primary) hypertension: Secondary | ICD-10-CM | POA: Insufficient documentation

## 2018-12-23 LAB — BASIC METABOLIC PANEL
Anion gap: 11 (ref 5–15)
BUN: 15 mg/dL (ref 6–20)
CO2: 22 mmol/L (ref 22–32)
Calcium: 9.1 mg/dL (ref 8.9–10.3)
Chloride: 102 mmol/L (ref 98–111)
Creatinine, Ser: 0.86 mg/dL (ref 0.61–1.24)
GFR calc Af Amer: >60 mL/min (ref >60)
GFR calc non Af Amer: >60 mL/min (ref >60)
Glucose, Bld: 125 mg/dL — ABNORMAL HIGH (ref 70–99)
Potassium: 3.6 mmol/L (ref 3.5–5.1)
Sodium: 135 mmol/L (ref 135–145)

## 2018-12-23 LAB — CBC
HCT: 42.2 % (ref 39.0–52.0)
Hemoglobin: 14.5 g/dL (ref 13.0–17.0)
MCH: 31.9 pg (ref 26.0–34.0)
MCHC: 34.4 g/dL (ref 30.0–36.0)
MCV: 92.7 fL (ref 80.0–100.0)
Platelets: 312 10*3/uL (ref 150–400)
RBC: 4.55 MIL/uL (ref 4.22–5.81)
RDW: 13.2 % (ref 11.5–15.5)
WBC: 8.1 10*3/uL (ref 4.0–10.5)
nRBC: 0 % (ref 0.0–0.2)

## 2018-12-23 LAB — TROPONIN I (HIGH SENSITIVITY)
Troponin I (High Sensitivity): 4 ng/L (ref <18)
Troponin I (High Sensitivity): 5 ng/L (ref <18)

## 2018-12-23 MED ORDER — LORAZEPAM 1 MG PO TABS
1.0000 mg | ORAL_TABLET | Freq: Once | ORAL | Status: AC
  Administered 2018-12-23: 1 mg via ORAL
  Filled 2018-12-23: qty 1

## 2018-12-23 MED ORDER — SODIUM CHLORIDE 0.9% FLUSH
3.0000 mL | Freq: Once | INTRAVENOUS | Status: AC
  Administered 2018-12-23: 3 mL via INTRAVENOUS

## 2018-12-23 NOTE — Discharge Instructions (Addendum)
You have had a normal EKG, cardiac labs and chest x-ray in the emergency department.  I recommend close follow-up with your primary care doctor.  I suspect that your chest pain is related to anxiety but recommend further evaluation by your PCP.

## 2018-12-23 NOTE — ED Provider Notes (Signed)
TIME SEEN: 4:53 AM  CHIEF COMPLAINT: Multiple complaints  HPI: Patient is a 53 year old male with history of hypertension, hyperlipidemia, anxiety who presents to the emergency department with multiple complaints.  States that he has been feeling intermittently lightheaded and "cloudy" like he is confused but states "not really".  He states he has had intermittent "fogginess" but no headache or head injury.  No numbness or focal weakness.  States sometimes his vision is blurry but not currently.  No vision loss.  Also states that he has had these intermittent episodes where he feels like he has had a pulled muscle in the left side of his chest.  It is better when he stretches.  He denies any chest tightness or chest pressure.  No shortness of breath but did have some nausea and dizziness.  No diaphoresis.  States that the symptoms seem to come on when he is going to sleep at night.  Pain is not exertional or pleuritic.  No history of PE, DVT, exogenous estrogen use, recent fractures, surgery, trauma, hospitalization or prolonged travel. No lower extremity swelling or pain. No calf tenderness.  ROS: See HPI Constitutional: no fever  Eyes: no drainage  ENT: no runny nose   Cardiovascular:   chest pain  Resp: no SOB  GI: no vomiting GU: no dysuria Integumentary: no rash  Allergy: no hives  Musculoskeletal: no leg swelling  Neurological: no slurred speech ROS otherwise negative  PAST MEDICAL HISTORY/PAST SURGICAL HISTORY:  Past Medical History:  Diagnosis Date  . Anxiety 12/30/2011  . Asthma   . DDD (degenerative disc disease)   . High cholesterol   . Hyperlipidemia   . Hypertension   . Unspecified asthma(493.90)     MEDICATIONS:  Prior to Admission medications   Medication Sig Start Date End Date Taking? Authorizing Provider  albuterol (PROVENTIL HFA;VENTOLIN HFA) 108 (90 Base) MCG/ACT inhaler Inhale 2 puffs into the lungs every 4 (four) hours as needed for wheezing or shortness of  breath. 12/06/17   Yu, Amy V, PA-C  AMLODIPINE BESYLATE PO Take by mouth.    [provider]  benzonatate (TESSALON) 100 MG capsule Take 1 capsule (100 mg total) by mouth every 8 (eight) hours. 04/08/18   Wurst, [place], PA-C  cetirizine-pseudoephedrine (ZYRTEC-D) 5-120 MG tablet Take 1 tablet by mouth daily. 07/08/17   Tasia Catchings, Amy V, PA-C  fluticasone (FLONASE) 50 MCG/ACT nasal spray Place 2 sprays into both nostrils daily. 07/08/17   Tasia Catchings, Amy V, PA-C  HYDROcodone-acetaminophen (NORCO/VICODIN) 5-325 MG tablet Take 1-2 tablets by mouth at bedtime as needed for severe pain. 04/08/18   Wurst, [place], PA-C  hydrOXYzine (ATARAX/VISTARIL) 25 MG tablet Take 1 tablet (25 mg total) by mouth every 6 (six) hours as needed. 12/21/18   Horton, Barbette Hair, MD  lidocaine (XYLOCAINE) 2 % solution Use as directed 15 mLs in the mouth or throat as needed for mouth pain. 07/08/17   Yu, Amy V, PA-C  LISINOPRIL PO Take by mouth.    [provider]  losartan (COZAAR) 50 MG tablet Take 50 mg by mouth daily.    [provider]  metoprolol tartrate (LOPRESSOR) 25 MG tablet Take 0.5 tablets (12.5 mg total) by mouth daily as needed. 07/29/17   Drenda Freeze, MD  multivitamin (ONE-A-DAY MEN'S) TABS Take 1 tablet by mouth daily.    [provider]  omeprazole (PRILOSEC) 20 MG capsule Take 1 capsule (20 mg total) by mouth daily. 05/13/18   Ezequiel Essex, MD  triamcinolone ointment (  KENALOG) 0.5 % Apply 1 application topically 2 (two) times daily. 11/20/17   Wurst, [place], PA-C    ALLERGIES:  Allergies  Allergen Reactions  . Peanuts [Peanut Oil] Anaphylaxis    SOCIAL HISTORY:  Social History   Tobacco Use  . Smoking status: Current Every Day Smoker    Packs/day: 1.00    Years: 32.00    Pack years: 32.00    Types: Cigarettes  . Smokeless tobacco: Never Used  Substance Use Topics  . Alcohol use: No    Frequency: Never    FAMILY HISTORY: Family History  Problem Relation Age of  Onset  . Hypertension Father   . Diabetes Sister   . High blood pressure Sister   . High blood pressure Sister   . Stroke Neg Hx   . Heart attack Neg Hx     EXAM: BP (!) 156/100   Pulse (!) 105   Temp 98.6 F (37 C)   Resp 20   SpO2 99%  CONSTITUTIONAL: Alert and oriented and responds appropriately to questions. Well-appearing; well-nourished HEAD: Normocephalic EYES: Conjunctivae clear, pupils appear equal, EOMI ENT: normal nose; moist mucous membranes NECK: Supple, no meningismus, no nuchal rigidity, no LAD  CARD: Regular and minimally tachycardic; S1 and S2 appreciated; no murmurs, no clicks, no rubs, no gallops RESP: Normal chest excursion without splinting or tachypnea; breath sounds clear and equal bilaterally; no wheezes, no rhonchi, no rales, no hypoxia or respiratory distress, speaking full sentences ABD/GI: Normal bowel sounds; non-distended; soft, non-tender, no rebound, no guarding, no peritoneal signs, no hepatosplenomegaly BACK:  The back appears normal and is non-tender to palpation, there is no CVA tenderness EXT: Normal ROM in all joints; non-tender to palpation; no edema; normal capillary refill; no cyanosis, no calf tenderness or swelling    SKIN: Normal color for age and race; warm; no rash NEURO: Moves all extremities equally, strength 5/5 in all 4 extremities, cranial nerves II through XII intact, normal speech, sensation to light touch intact diffusely PSYCH: Patient appears anxious.  MEDICAL DECISION MAKING: Patient here with atypical symptoms of chest pain.  He does have some risk factors for ACS and is requesting blood work today which I feel is reasonable.  It seems that many of his symptoms are likely contributed by anxiety.  This sounds very much like a panic attack.  He states they come on suddenly while he is at rest.  His EKG shows tachycardia.  He has no risk factors for PE but is not PERC negative because of the tachycardia.  He has no tachypnea or  hypoxia.  He has no chest pain currently.  EKG shows no ischemic abnormality.  He is requesting imaging of his head but we have had lengthy discussion and I do not feel he has intracranial hemorrhage, stroke, mass, meningitis and we have discussed the role of CT scan and MRI from the ED.  He verbalized understanding and is okay with no imaging at this time as I do not feel clinically it is indicated.  He is requesting something to help him relax.  Will give dose of oral Ativan here in the emergency department.  It appears patient [facility] for similar symptoms and was discharged with Vistaril which he states he has been taking intermittently without much relief.  ED PROGRESS: Patient labs are unremarkable including negative troponin.  Plan is to repeat second troponin in 2 hours.  If this  is negative, will discharge home with outpatient follow-up.  Patient feels better after Ativan and he is comfortable with this plan.  Signed out to oncoming ED physician to follow-up on patient second troponin.   I reviewed all nursing notes, vitals, pertinent previous records, EKGs, lab and urine results, imaging (as available).      EKG Interpretation  Date/Time:  Tuesday December 23 2018 04:48:20 EDT Ventricular Rate:  101 PR Interval:    QRS Duration: 99 QT Interval:  338 QTC Calculation: 439 R Axis:   16 Text Interpretation:  Sinus tachycardia Borderline prolonged PR interval Probable left atrial enlargement No significant change since last tracing Confirmed by Sevin Langenbach, Baxter HireKristen [phone]) on 12/23/2018 4:51:06 AM         Jahmiya Guidotti, Layla MawKristen N, DO 12/23/18 [id number]

## 2018-12-23 NOTE — ED Notes (Addendum)
Pt oob to bathroom with steady gait. Phlebotomy at bedside

## 2018-12-23 NOTE — ED Triage Notes (Signed)
Pt woke at 4am with L sided shoulder/chest pain with mild sob and nausea. EMS gave 325 mg ASA, pain free at present

## 2019-01-10 ENCOUNTER — Encounter (HOSPITAL_BASED_OUTPATIENT_CLINIC_OR_DEPARTMENT_OTHER): Payer: Self-pay | Admitting: Emergency Medicine

## 2019-01-10 ENCOUNTER — Emergency Department (HOSPITAL_BASED_OUTPATIENT_CLINIC_OR_DEPARTMENT_OTHER): Payer: Self-pay

## 2019-01-10 ENCOUNTER — Emergency Department (HOSPITAL_BASED_OUTPATIENT_CLINIC_OR_DEPARTMENT_OTHER)
Admission: EM | Admit: 2019-01-10 | Discharge: 2019-01-10 | Disposition: A | Discharge status: Home / Self Care | Payer: Self-pay | Attending: Emergency Medicine | Admitting: Emergency Medicine

## 2019-01-10 ENCOUNTER — Other Ambulatory Visit: Payer: Self-pay

## 2019-01-10 DIAGNOSIS — Z79899 Other long term (current) drug therapy: Secondary | ICD-10-CM | POA: Insufficient documentation

## 2019-01-10 DIAGNOSIS — E876 Hypokalemia: Secondary | ICD-10-CM | POA: Insufficient documentation

## 2019-01-10 DIAGNOSIS — R079 Chest pain, unspecified: Secondary | ICD-10-CM | POA: Insufficient documentation

## 2019-01-10 DIAGNOSIS — E785 Hyperlipidemia, unspecified: Secondary | ICD-10-CM | POA: Insufficient documentation

## 2019-01-10 DIAGNOSIS — I10 Essential (primary) hypertension: Secondary | ICD-10-CM | POA: Insufficient documentation

## 2019-01-10 DIAGNOSIS — R0602 Shortness of breath: Secondary | ICD-10-CM | POA: Insufficient documentation

## 2019-01-10 DIAGNOSIS — F17210 Nicotine dependence, cigarettes, uncomplicated: Secondary | ICD-10-CM | POA: Insufficient documentation

## 2019-01-10 LAB — CBC
HCT: 41.1 % (ref 39.0–52.0)
Hemoglobin: 13.9 g/dL (ref 13.0–17.0)
MCH: 30.8 pg (ref 26.0–34.0)
MCHC: 33.8 g/dL (ref 30.0–36.0)
MCV: 90.9 fL (ref 80.0–100.0)
Platelets: 299 10*3/uL (ref 150–400)
RBC: 4.52 MIL/uL (ref 4.22–5.81)
RDW: 13.2 % (ref 11.5–15.5)
WBC: 6.1 10*3/uL (ref 4.0–10.5)
nRBC: 0 % (ref 0.0–0.2)

## 2019-01-10 LAB — TROPONIN I (HIGH SENSITIVITY)
Troponin I (High Sensitivity): 3 ng/L (ref <18)
Troponin I (High Sensitivity): 3 ng/L (ref <18)

## 2019-01-10 LAB — BASIC METABOLIC PANEL
Anion gap: 14 (ref 5–15)
BUN: 14 mg/dL (ref 6–20)
CO2: 20 mmol/L — ABNORMAL LOW (ref 22–32)
Calcium: 8.9 mg/dL (ref 8.9–10.3)
Chloride: 103 mmol/L (ref 98–111)
Creatinine, Ser: 0.84 mg/dL (ref 0.61–1.24)
GFR calc Af Amer: >60 mL/min (ref >60)
GFR calc non Af Amer: >60 mL/min (ref >60)
Glucose, Bld: 93 mg/dL (ref 70–99)
Potassium: 3.2 mmol/L — ABNORMAL LOW (ref 3.5–5.1)
Sodium: 137 mmol/L (ref 135–145)

## 2019-01-10 MED ORDER — POTASSIUM CHLORIDE CRYS ER 20 MEQ PO TBCR
20.0000 meq | EXTENDED_RELEASE_TABLET | Freq: Once | ORAL | Status: AC
  Administered 2019-01-10: 20 meq via ORAL
  Filled 2019-01-10: qty 1

## 2019-01-10 NOTE — ED Notes (Signed)
ED Provider at bedside. 

## 2019-01-10 NOTE — ED Triage Notes (Signed)
Reports left sided chest pain that felt like a muscle pull.  Endorses SOB and rapid heart beat and felt like lips and fingers were numb.  Took hydroxizine an hour ago.  Unsure if this is anxiety or something else.

## 2019-01-10 NOTE — Discharge Instructions (Signed)
You were seen in the emergency department for some chest pressure and shortness of breath.  You had blood work chest x-ray and an EKG that did not show any serious cause of your symptoms.  Will be important for you to follow-up with your primary care doctor for continued work-up of this.  Please return if any concerns.

## 2019-01-10 NOTE — ED Provider Notes (Signed)
[facility] EMERGENCY DEPARTMENT Provider Note   CSN: [id number] Arrival date & time: 01/10/19  1549    History   Chief Complaint Chief Complaint  Patient presents with  . Chest Pain    HPI Daniel Valencia is a 53 y.o. male.  He is presenting after feeling some left-sided chest tightness with associated shortness of breath and left arm discomfort that started around 12 PM.  He said he was at a restaurant with a friend and started having some spasm in his left index finger.  This became more troublesome and then he started with the chest tightness in the left side of his chest and feeling some tingling in his lips.  He said his symptoms lasted probably 25 to 30 minutes.  He took a hydroxyzine that was prescribed for him a few weeks ago when he was in the ED.  He says his symptoms are resolved now and he feels back to baseline.  He has no prior cardiac history.  He does smoke cigarettes but denies any family history and denies cocaine.  HPI: A 53 year old patient with a history of hypertension and hypercholesterolemia presents for evaluation of chest pain. Initial onset of pain was approximately 3-6 hours ago. The patient's chest pain is described as heaviness/pressure/tightness and is not worse with exertion. The patient's chest pain is middle- or left-sided, is not well-localized, is not sharp and does not radiate to the arms/jaw/neck. The patient does not complain of nausea and denies diaphoresis. The patient has smoked in the past 90 days. The patient has no history of stroke, has no history of peripheral artery disease, denies any history of treated diabetes, has no relevant family history of coronary artery disease (first degree relative at less than age 54) and does not have an elevated BMI (>=30).   The history is provided by the patient.  Chest Pain Pain location:  L chest Pain quality: pressure   Pain radiates to:  Does not radiate Pain severity:  Moderate Onset quality:   Gradual Duration:  30 minutes Timing:  Constant Progression:  Resolved Chronicity:  Recurrent Relieved by:  Aspirin (hydroxaszine) Worsened by:  Nothing Ineffective treatments:  None tried Associated symptoms: anxiety, numbness and shortness of breath   Associated symptoms: no abdominal pain, no back pain, no cough, no fever, no headache, no heartburn, no syncope, no vomiting and no weakness   Risk factors: high cholesterol, hypertension, male sex and smoking   Risk factors: no diabetes mellitus     Past Medical History:  Diagnosis Date  . Anxiety 12/30/2011  . Asthma   . DDD (degenerative disc disease)   . High cholesterol   . Hyperlipidemia   . Hypertension   . Unspecified asthma(493.90)     Patient Active Problem List   Diagnosis Date Noted  . Left arm numbness 12/14/2013  . TIA (transient ischemic attack) 11/18/2013  . Ingrown nail 07/07/2013  . Pain, joint, foot, right 07/07/2013  . Bipolar affective disorder (HCC) 02/21/2012  . Allergic rhinitis, cause unspecified 02/21/2012  . Concentration deficit 02/21/2012  . Anxiety disorder 12/30/2011  . Chronic lower back pain 11/12/2011  . Hyperlipidemia   . Routine general medical examination at a health care facility 11/09/2011  . Unspecified asthma(493.90)   . Hypertension     Past Surgical History:  Procedure Laterality Date  . CHOLECYSTECTOMY    . right foot surgury  apirl 2013   [facility]  . right leg surgury     pin  in right leg        Home Medications    Prior to Admission medications   Medication Sig Start Date End Date Taking? Authorizing Provider  albuterol (PROVENTIL HFA;VENTOLIN HFA) 108 (90 Base) MCG/ACT inhaler Inhale 2 puffs into the lungs every 4 (four) hours as needed for wheezing or shortness of breath. 12/06/17   Yu, Amy V, PA-C  AMLODIPINE BESYLATE PO Take by mouth.    [provider]  benzonatate (TESSALON) 100 MG capsule Take 1 capsule (100 mg total) by mouth every 8 (eight)  hours. 04/08/18   Wurst, [place], PA-C  cetirizine-pseudoephedrine (ZYRTEC-D) 5-120 MG tablet Take 1 tablet by mouth daily. 07/08/17   Tasia Catchings, Amy V, PA-C  fluticasone (FLONASE) 50 MCG/ACT nasal spray Place 2 sprays into both nostrils daily. 07/08/17   Tasia Catchings, Amy V, PA-C  HYDROcodone-acetaminophen (NORCO/VICODIN) 5-325 MG tablet Take 1-2 tablets by mouth at bedtime as needed for severe pain. 04/08/18   Wurst, [place], PA-C  hydrOXYzine (ATARAX/VISTARIL) 25 MG tablet Take 1 tablet (25 mg total) by mouth every 6 (six) hours as needed. 12/21/18   Horton, Barbette Hair, MD  lidocaine (XYLOCAINE) 2 % solution Use as directed 15 mLs in the mouth or throat as needed for mouth pain. 07/08/17   Yu, Amy V, PA-C  LISINOPRIL PO Take by mouth.    [provider]  losartan (COZAAR) 50 MG tablet Take 50 mg by mouth daily.    [provider]  metoprolol tartrate (LOPRESSOR) 25 MG tablet Take 0.5 tablets (12.5 mg total) by mouth daily as needed. 07/29/17   Drenda Freeze, MD  multivitamin (ONE-A-DAY MEN'S) TABS Take 1 tablet by mouth daily.    [provider]  omeprazole (PRILOSEC) 20 MG capsule Take 1 capsule (20 mg total) by mouth daily. 05/13/18   Rancour, Annie Main, MD  triamcinolone ointment (KENALOG) 0.5 % Apply 1 application topically 2 (two) times daily. 11/20/17   Lestine Box, PA-C    Family History Family History  Problem Relation Age of Onset  . Hypertension Father   . Diabetes Sister   . High blood pressure Sister   . High blood pressure Sister   . Stroke Neg Hx   . Heart attack Neg Hx     Social History Social History   Tobacco Use  . Smoking status: Current Every Day Smoker    Packs/day: 1.00    Years: 32.00    Pack years: 32.00    Types: Cigarettes  . Smokeless tobacco: Never Used  Substance Use Topics  . Alcohol use: No    Frequency: Never  . Drug use: No     Allergies   Peanuts [peanut oil]   Review of Systems Review of Systems  Constitutional: Negative  for fever.  HENT: Negative for sore throat.   Eyes: Negative for visual disturbance.  Respiratory: Positive for shortness of breath. Negative for cough.   Cardiovascular: Positive for chest pain. Negative for syncope.  Gastrointestinal: Negative for abdominal pain, heartburn and vomiting.  Genitourinary: Negative for dysuria.  Musculoskeletal: Negative for back pain.  Skin: Negative for rash.  Neurological: Positive for numbness. Negative for weakness and headaches.     Physical Exam Updated Vital Signs BP (!) 145/92 (BP Location: Right Arm)   Pulse 95   Temp 99 F (37.2 C) (Oral)   Resp 13   Ht 5\' 9"  (1.753 m)   Wt 91.6 kg   SpO2 98%   BMI 29.83 kg/m   Physical Exam Vitals  signs and nursing note reviewed.  Constitutional:      Appearance: He is well-developed.  HENT:     Head: Normocephalic and atraumatic.  Eyes:     Conjunctiva/sclera: Conjunctivae normal.  Neck:     Musculoskeletal: Neck supple.  Cardiovascular:     Rate and Rhythm: Normal rate and regular rhythm.     Heart sounds: Normal heart sounds. No murmur.  Pulmonary:     Effort: Pulmonary effort is normal. No respiratory distress.     Breath sounds: Normal breath sounds.  Abdominal:     Palpations: Abdomen is soft.     Tenderness: There is no abdominal tenderness.  Musculoskeletal: Normal range of motion.     Right lower leg: He exhibits no tenderness. No edema.     Left lower leg: He exhibits no tenderness. No edema.  Skin:    General: Skin is warm and dry.     Capillary Refill: Capillary refill takes less than 2 seconds.  Neurological:     General: No focal deficit present.     Mental Status: He is alert and oriented to person, place, and time.      ED Treatments / Results  Labs (all labs ordered are listed, but only abnormal results are displayed) Labs Reviewed  BASIC METABOLIC PANEL - Abnormal; Notable for the following components:      Result Value   Potassium 3.2 (*)    CO2 20 (*)     All other components within normal limits  CBC  TROPONIN I (HIGH SENSITIVITY)  TROPONIN I (HIGH SENSITIVITY)    EKG EKG Interpretation  Date/Time:  Saturday January 10 2019 15:58:09 EDT Ventricular Rate:  97 PR Interval:    QRS Duration: 98 QT Interval:  346 QTC Calculation: 440 R Axis:   30 Text Interpretation:  Sinus rhythm similar to prior 7/20 Confirmed by Meridee ScoreButler, [place] Gandolfo [phone]([id number]) on 01/10/2019 4:30:58 PM   Radiology Dg Chest Port 1 View  Result Date: 01/10/2019 CLINICAL DATA:  Left-sided chest pain.  Smoker. EXAM: PORTABLE CHEST 1 VIEW COMPARISON:  12/23/2018 FINDINGS: Numerous leads and wires project over the chest. Midline trachea. Normal heart size and mediastinal contours. No pleural effusion or pneumothorax. Clear lungs. IMPRESSION: No acute cardiopulmonary disease. Electronically Signed   By: Jeronimo GreavesKyle  Talbot M.D.   On: 01/10/2019 17:08    Procedures Procedures (including critical care time)  Medications Ordered in ED Medications  potassium chloride SA (K-DUR) CR tablet 20 mEq (20 mEq Oral Given 01/10/19 1744)     Initial Impression / Assessment and Plan / ED Course  I have reviewed the triage vital signs and the nursing notes.  Pertinent labs & imaging results that were available during my care of the patient were reviewed by me and considered in my medical decision making (see chart for details).  Clinical Course as of Jan 11 1016  Sat Jan 10, 2019  [id number] 53 year old male with history of hypertension hypercholesterolemia smoking here with some left-sided chest discomfort in the setting of some spasm in his left hand and associated with some shortness of breath.  EKG is unremarkable.  He was here 2 weeks ago with similar story.  He tried the hydroxyzine that he was prescribed with some improvement in his symptoms.  Getting troponin labs and chest x-ray.   [MB]  1708 Acute disease.Chest x-ray did not show any acute process.  Labs show a mildly low potassium 3.2 but otherwise  unremarkable with troponin pending.   [MB]  2001 Delta  troponin is unchanged.  I reviewed all this with the patient and he understands he will need to follow-up with his primary for further evaluation.   [MB]    Clinical Course User Index [MB] Terrilee FilesButler, Davarious Tumbleson C, MD    [facility] 4   Final Clinical Impressions(s) / ED Diagnoses   Final diagnoses:  Nonspecific chest pain  Hypokalemia    ED Discharge Orders    None       Terrilee FilesButler, Shulamit Donofrio C, MD 01/11/19 1017

## 2019-02-11 ENCOUNTER — Encounter (HOSPITAL_COMMUNITY): Payer: Self-pay | Admitting: *Deleted

## 2019-02-11 ENCOUNTER — Emergency Department (HOSPITAL_COMMUNITY)
Admission: EM | Admit: 2019-02-11 | Discharge: 2019-02-11 | Disposition: A | Discharge status: Home / Self Care | Payer: PRIVATE HEALTH INSURANCE | Attending: Emergency Medicine | Admitting: Emergency Medicine

## 2019-02-11 ENCOUNTER — Emergency Department (HOSPITAL_COMMUNITY): Payer: PRIVATE HEALTH INSURANCE

## 2019-02-11 DIAGNOSIS — F17210 Nicotine dependence, cigarettes, uncomplicated: Secondary | ICD-10-CM | POA: Diagnosis not present

## 2019-02-11 DIAGNOSIS — I10 Essential (primary) hypertension: Secondary | ICD-10-CM | POA: Diagnosis not present

## 2019-02-11 DIAGNOSIS — R079 Chest pain, unspecified: Secondary | ICD-10-CM | POA: Diagnosis present

## 2019-02-11 DIAGNOSIS — R0602 Shortness of breath: Secondary | ICD-10-CM | POA: Insufficient documentation

## 2019-02-11 DIAGNOSIS — J45909 Unspecified asthma, uncomplicated: Secondary | ICD-10-CM | POA: Insufficient documentation

## 2019-02-11 DIAGNOSIS — R1013 Epigastric pain: Secondary | ICD-10-CM | POA: Diagnosis not present

## 2019-02-11 LAB — TROPONIN I (HIGH SENSITIVITY)
Troponin I (High Sensitivity): 5 ng/L (ref <18)
Troponin I (High Sensitivity): 5 ng/L (ref <18)

## 2019-02-11 LAB — COMPREHENSIVE METABOLIC PANEL
ALT: 23 U/L (ref 0–44)
AST: 20 U/L (ref 15–41)
Albumin: 3.9 g/dL (ref 3.5–5.0)
Alkaline Phosphatase: 86 U/L (ref 38–126)
Anion gap: 12 (ref 5–15)
BUN: 10 mg/dL (ref 6–20)
CO2: 21 mmol/L — ABNORMAL LOW (ref 22–32)
Calcium: 9.1 mg/dL (ref 8.9–10.3)
Chloride: 103 mmol/L (ref 98–111)
Creatinine, Ser: 0.77 mg/dL (ref 0.61–1.24)
GFR calc Af Amer: >60 mL/min (ref >60)
GFR calc non Af Amer: >60 mL/min (ref >60)
Glucose, Bld: 93 mg/dL (ref 70–99)
Potassium: 3.9 mmol/L (ref 3.5–5.1)
Sodium: 136 mmol/L (ref 135–145)
Total Bilirubin: 0.6 mg/dL (ref 0.3–1.2)
Total Protein: 7.2 g/dL (ref 6.5–8.1)

## 2019-02-11 LAB — CBC
HCT: 44.2 % (ref 39.0–52.0)
Hemoglobin: 15.1 g/dL (ref 13.0–17.0)
MCH: 31.7 pg (ref 26.0–34.0)
MCHC: 34.2 g/dL (ref 30.0–36.0)
MCV: 92.7 fL (ref 80.0–100.0)
Platelets: 342 10*3/uL (ref 150–400)
RBC: 4.77 MIL/uL (ref 4.22–5.81)
RDW: 14 % (ref 11.5–15.5)
WBC: 5.2 10*3/uL (ref 4.0–10.5)
nRBC: 0 % (ref 0.0–0.2)

## 2019-02-11 LAB — LIPASE, BLOOD: Lipase: 26 U/L (ref 11–51)

## 2019-02-11 MED ORDER — ALUM & MAG HYDROXIDE-SIMETH 200-200-20 MG/5ML PO SUSP
30.0000 mL | Freq: Once | ORAL | Status: AC
  Administered 2019-02-11: 09:00:00 30 mL via ORAL
  Filled 2019-02-11: qty 30

## 2019-02-11 MED ORDER — SUCRALFATE 1 G PO TABS: 1.0000 g | ORAL_TABLET | Freq: Four times a day (QID) | ORAL | 0 refills | Status: AC | PRN

## 2019-02-11 MED ORDER — LIDOCAINE VISCOUS HCL 2 % MT SOLN
15.0000 mL | Freq: Once | OROMUCOSAL | Status: AC
  Administered 2019-02-11: 15 mL via ORAL
  Filled 2019-02-11: qty 15

## 2019-02-11 MED ORDER — OMEPRAZOLE 20 MG PO CPDR: 20.0000 mg | DELAYED_RELEASE_CAPSULE | Freq: Every day | ORAL | 1 refills | Status: DC

## 2019-02-11 NOTE — ED Triage Notes (Signed)
To ED for eval of abd burning for the past few weeks and woke today with palpitations in chest. Denies nausea or vomiting. Skin w/d. Resp e/u.

## 2019-02-11 NOTE — Discharge Instructions (Addendum)
You were evaluated in the Emergency Department and after careful evaluation, we did not find any emergent condition requiring admission or further testing in the hospital.  Your exam/testing today is overall reassuring.  Your symptoms seem to be due to pain related to inflammation of the lining of the stomach.  We recommend taking 2 new medications.  The omeprazole is a long-term medication that prevents acid related pain and is taken once a day.  The Carafate medication can be used as needed throughout the day up to 4 times a day.   Please return to the Emergency Department if you experience any worsening of your condition.  We encourage you to follow up with a primary care provider.  Thank you for allowing [place] to be a part of your care.

## 2019-02-11 NOTE — ED Notes (Signed)
X-ray at bedside

## 2019-02-11 NOTE — ED Provider Notes (Signed)
[facility] Provider Note MRN:  [id number]  Arrival date & time: 02/11/19     Chief Complaint   Chest Pain   History of Present Illness   Daniel Valencia is a 53 y.o. year-old male with a history of hypertension, hyperlipidemia presenting to the ED with chief complaint of chest pain.  Location: Central chest and epigastrium Duration: 3 days Onset: Gradual Timing: Constant Description: Burning Severity: Mild to moderate Exacerbating/Alleviating Factors: None Associated Symptoms: Occasional shortness of breath, thinks he might have seen some dark or black stools last week. Pertinent Negatives: Denies fever, no cough, no dizziness or diaphoresis, no nausea or vomiting, no lower abdominal pain.   Review of Systems  A complete 10 system review of systems was obtained and all systems are negative except as noted in the HPI and PMH.   Patient's Health History    Past Medical History:  Diagnosis Date  . Anxiety 12/30/2011  . Asthma   . DDD (degenerative disc disease)   . High cholesterol   . Hyperlipidemia   . Hypertension   . Unspecified asthma(493.90)     Past Surgical History:  Procedure Laterality Date  . CHOLECYSTECTOMY    . right foot surgury  apirl 2013   [facility]  . right leg surgury     pin in right leg    Family History  Problem Relation Age of Onset  . Hypertension Father   . Diabetes Sister   . High blood pressure Sister   . High blood pressure Sister   . Stroke Neg Hx   . Heart attack Neg Hx     Social History   Socioeconomic History  . Marital status: Single    Spouse name: Not on file  . Number of children: 0  . Years of education: HS  . Highest education level: Not on file  Occupational History  . 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 Financial resource strain: Not on file  . Food insecurity    Worry: Not on file    Inability: Not on file  .  Transportation needs    Medical: Not on file    Non-medical: Not on file  Tobacco Use  . Smoking status: Current Every Day Smoker    Packs/day: 1.00    Years: 32.00    Pack years: 32.00    Types: Cigarettes  . Smokeless tobacco: Never Used  Substance and Sexual Activity  . Alcohol use: No    Frequency: Never  . Drug use: No  . Sexual activity: Not on file  Lifestyle  . Physical activity    Days per week: Not on file    Minutes per session: Not on file  . Stress: Not on file  Relationships  . Social Musicianconnections    Talks on phone: Not on file    Gets together: Not on file    Attends religious service: Not on file    Active member of club or organization: Not on file    Attends meetings of clubs or organizations: Not on file    Relationship status: Not on file  . Intimate partner violence    Fear of current or ex partner: Not on file    Emotionally abused: Not on file    Physically abused: Not on file    Forced sexual activity: Not on file  Other Topics Concern  . Not on file  Social  History Narrative   Patient lives at home with his roommate.   Caffeine Use: Yes     Physical Exam  Vital Signs and Nursing Notes reviewed Vitals:   02/11/19 1130 02/11/19 1200  BP: 126/85 136/82  Pulse: 87   Resp: 17 19  Temp:    SpO2: 97%     CONSTITUTIONAL: Well-appearing, NAD NEURO:  Alert and oriented x 3, no focal deficits EYES:  eyes equal and reactive ENT/NECK:  no LAD, no JVD CARDIO: Regular rate, well-perfused, normal S1 and S2 PULM:  CTAB no wheezing or rhonchi GI/GU:  normal bowel sounds, non-distended, non-tender MSK/SPINE:  No gross deformities, no edema SKIN:  no rash, atraumatic PSYCH:  Appropriate speech and behavior  Diagnostic and Interventional Summary    EKG Interpretation  Date/Time:  Wednesday February 11 2019 04:28:08 EDT Ventricular Rate:  88 PR Interval:  200 QRS Duration: 92 QT Interval:  334 QTC Calculation: 404 R Axis:   7 Text  Interpretation:  Normal sinus rhythm Possible Left atrial enlargement Borderline ECG When compared with ECG of 01/10/2019, No significant change was found Confirmed by Dione Booze ([id number]) on 02/11/2019 5:12:56 AM      Labs Reviewed  COMPREHENSIVE METABOLIC PANEL - Abnormal; Notable for the following components:      Result Value   CO2 21 (*)    All other components within normal limits  CBC  LIPASE, BLOOD  TROPONIN I (HIGH SENSITIVITY)  TROPONIN I (HIGH SENSITIVITY)    DG Chest Port 1 View  Final Result      Medications  alum & mag hydroxide-simeth (MAALOX/MYLANTA) 200-200-20 MG/5ML suspension 30 mL (30 mLs Oral Given 02/11/19 0832)    And  lidocaine (XYLOCAINE) 2 % viscous mouth solution 15 mL (15 mLs Oral Given 02/11/19 1572)     Procedures Critical Care  ED Course and Medical Decision Making  I have reviewed the triage vital signs and the nursing notes.  Pertinent labs & imaging results that were available during my care of the patient were reviewed by me and considered in my medical decision making (see below for details).  Atypical chest pain, mostly this is a presentation for epigastric burning pain in this 53 year old male, suspect gastritis.  He does have a few risk factors for cardiovascular disease, will screen with troponin.  EKG is without significant ischemic changes.  Also considering pancreatitis.  Work-up is pending.      Work-up is reassuring, troponin is negative x2.  Feeling better after GI cocktail.  Consistent with GERD related pain.  Symptomatic control, PCP follow-up.  Elmer Sow. Pilar Plate, MD [facility] Emergency Medicine [facility] [email or web address]  Final Clinical Impressions(s) / ED Diagnoses     ICD-10-CM   1. Epigastric pain  R10.13 DG Chest Port 1 View    DG Chest Port 1 View    ED Discharge Orders         Ordered    omeprazole (PRILOSEC) 20 MG capsule  Daily     02/11/19 1243    sucralfate (CARAFATE) 1 g tablet  4 times daily  PRN     02/11/19 1243          Discharge Instructions Discussed with and Provided to Patient:   Discharge Instructions     You were evaluated in the Emergency Department and after careful evaluation, we did not find any emergent condition requiring admission or further testing in the hospital.  Your exam/testing today is overall reassuring.  Your symptoms seem  to be due to pain related to inflammation of the lining of the stomach.  We recommend taking 2 new medications.  The omeprazole is a long-term medication that prevents acid related pain and is taken once a day.  The Carafate medication can be used as needed throughout the day up to 4 times a day.   Please return to the Emergency Department if you experience any worsening of your condition.  We encourage you to follow up with a primary care provider.  Thank you for allowing [place] to be a part of your care.       Maudie Flakes, MD 02/11/19 1245

## 2019-02-13 ENCOUNTER — Encounter (HOSPITAL_BASED_OUTPATIENT_CLINIC_OR_DEPARTMENT_OTHER): Payer: Self-pay

## 2019-02-13 ENCOUNTER — Emergency Department (HOSPITAL_BASED_OUTPATIENT_CLINIC_OR_DEPARTMENT_OTHER)
Admission: EM | Admit: 2019-02-13 | Discharge: 2019-02-13 | Disposition: A | Discharge status: Home / Self Care | Payer: PRIVATE HEALTH INSURANCE | Attending: Emergency Medicine | Admitting: Emergency Medicine

## 2019-02-13 ENCOUNTER — Other Ambulatory Visit: Payer: Self-pay

## 2019-02-13 DIAGNOSIS — Z5321 Procedure and treatment not carried out due to patient leaving prior to being seen by health care provider: Secondary | ICD-10-CM | POA: Diagnosis not present

## 2019-02-13 DIAGNOSIS — R109 Unspecified abdominal pain: Secondary | ICD-10-CM | POA: Insufficient documentation

## 2019-02-13 NOTE — ED Triage Notes (Addendum)
C/o abd pain x 2 months-reports "dark stools" today-abd "burning" no relief with rx med and OTC med-NAD-steady gait

## 2019-02-14 ENCOUNTER — Encounter (HOSPITAL_COMMUNITY): Payer: Self-pay

## 2019-02-14 ENCOUNTER — Ambulatory Visit (HOSPITAL_COMMUNITY)
Admission: EM | Admit: 2019-02-14 | Discharge: 2019-02-14 | Disposition: A | Discharge status: Home / Self Care | Payer: PRIVATE HEALTH INSURANCE | Attending: Family Medicine | Admitting: Family Medicine

## 2019-02-14 DIAGNOSIS — F410 Panic disorder [episodic paroxysmal anxiety] without agoraphobia: Secondary | ICD-10-CM | POA: Diagnosis not present

## 2019-02-14 MED ORDER — FLUOXETINE HCL 10 MG PO TABS: 10.0000 mg | ORAL_TABLET | ORAL | 3 refills | Status: DC

## 2019-02-14 NOTE — Discharge Instructions (Addendum)
Use Pepcid AC twice a day for 3 days  Return Monday or Wednesday if symptoms persist.

## 2019-02-14 NOTE — ED Provider Notes (Signed)
[place]    CSN: [id number] Arrival date & time: 02/14/19  1223      History   Chief Complaint Chief Complaint  Patient presents with  . Abdominal Pain  . Black Stool    HPI Daniel Valencia is a 53 y.o. male.   Established 53 yo gentleman with black stool and abdominal pain.  She was seen 3 days ago in the emergency department for chest pain and was treated with  sucralfate.  Patient did not take any Pepcid because he was afraid he might have side effects like the ones encountered with Zantac.  Patient was seen in the emergency department, he was having abdominal pain and panic symptoms.  He does not feel that the emergency room doctor appreciated the degree of his panic symptoms, nor paid any attention to his abdominal pain complaints.     Past Medical History:  Diagnosis Date  . Anxiety 12/30/2011  . Asthma   . DDD (degenerative disc disease)   . High cholesterol   . Hyperlipidemia   . Hypertension   . Unspecified asthma(493.90)     Patient Active Problem List   Diagnosis Date Noted  . Left arm numbness 12/14/2013  . TIA (transient ischemic attack) 11/18/2013  . Ingrown nail 07/07/2013  . Pain, joint, foot, right 07/07/2013  . Bipolar affective disorder ([place]) 02/21/2012  . Allergic rhinitis, cause unspecified 02/21/2012  . Concentration deficit 02/21/2012  . Anxiety disorder 12/30/2011  . Chronic lower back pain 11/12/2011  . Hyperlipidemia   . Routine general medical examination at a health care facility 11/09/2011  . Unspecified asthma(493.90)   . Hypertension     Past Surgical History:  Procedure Laterality Date  . CHOLECYSTECTOMY    . right foot surgury  apirl 2013   [facility]  . right leg surgury     pin in right leg       Home Medications    Prior to Admission medications   Medication Sig Start Date End Date Taking? Authorizing Provider  albuterol (PROVENTIL HFA;VENTOLIN HFA) 108 (90 Base) MCG/ACT inhaler Inhale 2 puffs into  the lungs every 4 (four) hours as needed for wheezing or shortness of breath. 12/06/17   Tasia Catchings, Amy V, PA-C  amLODipine (NORVASC) 10 MG tablet Take 10 mg by mouth daily.     [provider]  busPIRone (BUSPAR) 10 MG tablet Take 1 tablet by mouth 2 (two) times daily. 01/15/19   [provider]  FLUoxetine (PROZAC) 10 MG tablet Take 1 tablet (10 mg total) by mouth once a week. 02/14/19   Robyn Haber, MD  losartan (COZAAR) 50 MG tablet Take 50 mg by mouth daily.    [provider]  multivitamin (ONE-A-DAY MEN'S) TABS Take 1 tablet by mouth daily.    [provider]  simvastatin (ZOCOR) 20 MG tablet Take 20 mg by mouth daily. 11/03/18   [provider]  sucralfate (CARAFATE) 1 g tablet Take 1 tablet (1 g total) by mouth 4 (four) times daily as needed. 02/11/19   Maudie Flakes, MD  fluticasone (FLONASE) 50 MCG/ACT nasal spray Place 2 sprays into both nostrils daily. Patient not taking: Reported on 02/11/2019 07/08/17 02/14/19  Ok Edwards, PA-C  metoprolol tartrate (LOPRESSOR) 25 MG tablet Take 0.5 tablets (12.5 mg total) by mouth daily as needed. Patient not taking: Reported on 02/11/2019 07/29/17 02/14/19  Drenda Freeze, MD  omeprazole (PRILOSEC) 20 MG capsule Take 1 capsule (20 mg total) by mouth  daily. 02/11/19 02/14/19  Sabas Sous, MD    Family History Family History  Problem Relation Age of Onset  . Hypertension Father   . Diabetes Sister   . High blood pressure Sister   . High blood pressure Sister   . Stroke Neg Hx   . Heart attack Neg Hx     Social History Social History   Tobacco Use  . Smoking status: Current Every Day Smoker    Packs/day: 1.00    Years: 32.00    Pack years: 32.00    Types: Cigarettes  . Smokeless tobacco: Never Used  Substance Use Topics  . Alcohol use: Yes    Frequency: Never    Comment: occ  . Drug use: No     Allergies   Peanuts [peanut oil]   Review of Systems Review of Systems  Gastrointestinal:  Positive for abdominal pain.  Psychiatric/Behavioral: The patient is nervous/anxious.   All other systems reviewed and are negative.    Physical Exam Triage Vital Signs ED Triage Vitals  Enc Vitals Group     BP 02/14/19 1314 (!) 142/87     Pulse Rate 02/14/19 1314 85     Resp 02/14/19 1314 18     Temp 02/14/19 1314 98.4 F (36.9 C)     Temp Source 02/14/19 1314 Oral     SpO2 02/14/19 1314 97 %     Weight --      Height --      Head Circumference --      Peak Flow --      Pain Score 02/14/19 1316 6     Pain Loc --      Pain Edu? --      Excl. in GC? --    No data found.  Updated Vital Signs BP (!) 142/87 (BP Location: Left Arm)   Pulse 85   Temp 98.4 F (36.9 C) (Oral)   Resp 18   SpO2 97%    Physical Exam Vitals signs and nursing note reviewed.  Constitutional:      Appearance: He is well-developed. He is obese.  HENT:     Head: Normocephalic.  Eyes:     Extraocular Movements: Extraocular movements intact.  Cardiovascular:     Rate and Rhythm: Normal rate and regular rhythm.  Pulmonary:     Effort: Pulmonary effort is normal.     Breath sounds: Normal breath sounds.  Abdominal:     General: Bowel sounds are increased.     Palpations: Abdomen is soft.     Tenderness: There is no guarding.  Genitourinary:    Rectum: Normal.     Comments: Rectal exam is normal, Hemoccult is negative Skin:    General: Skin is warm and dry.  Neurological:     General: No focal deficit present.     Mental Status: He is alert.  Psychiatric:        Mood and Affect: Mood is anxious.      UC Treatments / Results  Labs (all labs ordered are listed, but only abnormal results are displayed) Labs Reviewed - No data to display  EKG   Radiology No results found.  Procedures Procedures (including critical care time)  Medications Ordered in UC Medications - No data to display  Initial Impression / Assessment and Plan / UC Course  I have reviewed the triage vital  signs and the nursing notes.  Pertinent labs & imaging results that were available during my care of the  patient were reviewed by me and considered in my medical decision making (see chart for details).    Final Clinical Impressions(s) / UC Diagnoses   Final diagnoses:  Panic attacks     Discharge Instructions     Use Pepcid AC twice a day for 3 days  Return Monday or Wednesday if symptoms persist.    ED Prescriptions    Medication Sig Dispense Auth. Provider   FLUoxetine (PROZAC) 10 MG tablet Take 1 tablet (10 mg total) by mouth once a week. 10 tablet Elvina SidleLauenstein, Wrenley Sayed, MD     Controlled Substance Prescriptions [place] Controlled Substance Registry consulted? Not Applicable   Elvina SidleLauenstein, Phyllistine Domingos, MD 02/14/19 1346

## 2019-02-14 NOTE — ED Triage Notes (Signed)
Pt presents with generalized abdominal pain and black stool that seems like it has blood in X 2 days.

## 2019-02-15 ENCOUNTER — Other Ambulatory Visit: Payer: Self-pay

## 2019-02-15 ENCOUNTER — Encounter (HOSPITAL_COMMUNITY): Payer: Self-pay | Admitting: Emergency Medicine

## 2019-02-15 ENCOUNTER — Emergency Department (HOSPITAL_COMMUNITY)
Admission: EM | Admit: 2019-02-15 | Discharge: 2019-02-16 | Disposition: A | Discharge status: Home / Self Care | Payer: PRIVATE HEALTH INSURANCE | Attending: Emergency Medicine | Admitting: Emergency Medicine

## 2019-02-15 ENCOUNTER — Emergency Department (HOSPITAL_COMMUNITY): Payer: PRIVATE HEALTH INSURANCE

## 2019-02-15 DIAGNOSIS — J45909 Unspecified asthma, uncomplicated: Secondary | ICD-10-CM | POA: Diagnosis not present

## 2019-02-15 DIAGNOSIS — R0602 Shortness of breath: Secondary | ICD-10-CM

## 2019-02-15 DIAGNOSIS — R002 Palpitations: Secondary | ICD-10-CM | POA: Diagnosis not present

## 2019-02-15 DIAGNOSIS — F17210 Nicotine dependence, cigarettes, uncomplicated: Secondary | ICD-10-CM | POA: Insufficient documentation

## 2019-02-15 DIAGNOSIS — Z8673 Personal history of transient ischemic attack (TIA), and cerebral infarction without residual deficits: Secondary | ICD-10-CM | POA: Insufficient documentation

## 2019-02-15 DIAGNOSIS — I10 Essential (primary) hypertension: Secondary | ICD-10-CM | POA: Insufficient documentation

## 2019-02-15 DIAGNOSIS — F419 Anxiety disorder, unspecified: Secondary | ICD-10-CM

## 2019-02-15 DIAGNOSIS — R61 Generalized hyperhidrosis: Secondary | ICD-10-CM | POA: Insufficient documentation

## 2019-02-15 DIAGNOSIS — Z5321 Procedure and treatment not carried out due to patient leaving prior to being seen by health care provider: Secondary | ICD-10-CM | POA: Diagnosis not present

## 2019-02-15 LAB — CBC
HCT: 44.6 % (ref 39.0–52.0)
Hemoglobin: 15.1 g/dL (ref 13.0–17.0)
MCH: 31.5 pg (ref 26.0–34.0)
MCHC: 33.9 g/dL (ref 30.0–36.0)
MCV: 93.1 fL (ref 80.0–100.0)
Platelets: 331 10*3/uL (ref 150–400)
RBC: 4.79 MIL/uL (ref 4.22–5.81)
RDW: 13.8 % (ref 11.5–15.5)
WBC: 6.3 10*3/uL (ref 4.0–10.5)
nRBC: 0 % (ref 0.0–0.2)

## 2019-02-15 LAB — BASIC METABOLIC PANEL
Anion gap: 10 (ref 5–15)
BUN: 12 mg/dL (ref 6–20)
CO2: 24 mmol/L (ref 22–32)
Calcium: 9.3 mg/dL (ref 8.9–10.3)
Chloride: 100 mmol/L (ref 98–111)
Creatinine, Ser: 0.84 mg/dL (ref 0.61–1.24)
GFR calc Af Amer: >60 mL/min (ref >60)
GFR calc non Af Amer: >60 mL/min (ref >60)
Glucose, Bld: 112 mg/dL — ABNORMAL HIGH (ref 70–99)
Potassium: 3.8 mmol/L (ref 3.5–5.1)
Sodium: 134 mmol/L — ABNORMAL LOW (ref 135–145)

## 2019-02-15 LAB — TROPONIN I (HIGH SENSITIVITY): Troponin I (High Sensitivity): 5 ng/L (ref <18)

## 2019-02-15 NOTE — ED Triage Notes (Signed)
C/o SOB and mild numbness/tingling to L arm x 40 min that started while watching a football game.  Denies pain.  No arm drift.

## 2019-02-16 ENCOUNTER — Other Ambulatory Visit: Payer: Self-pay

## 2019-02-16 ENCOUNTER — Emergency Department (HOSPITAL_BASED_OUTPATIENT_CLINIC_OR_DEPARTMENT_OTHER)
Admission: EM | Admit: 2019-02-16 | Discharge: 2019-02-16 | Disposition: A | Discharge status: Home / Self Care | Payer: PRIVATE HEALTH INSURANCE | Attending: Emergency Medicine | Admitting: Emergency Medicine

## 2019-02-16 ENCOUNTER — Encounter (HOSPITAL_BASED_OUTPATIENT_CLINIC_OR_DEPARTMENT_OTHER): Payer: Self-pay

## 2019-02-16 DIAGNOSIS — Z5321 Procedure and treatment not carried out due to patient leaving prior to being seen by health care provider: Secondary | ICD-10-CM | POA: Diagnosis not present

## 2019-02-16 DIAGNOSIS — R0602 Shortness of breath: Secondary | ICD-10-CM | POA: Diagnosis present

## 2019-02-16 DIAGNOSIS — R51 Headache: Secondary | ICD-10-CM | POA: Diagnosis not present

## 2019-02-16 DIAGNOSIS — R200 Anesthesia of skin: Secondary | ICD-10-CM | POA: Insufficient documentation

## 2019-02-16 LAB — TROPONIN I (HIGH SENSITIVITY): Troponin I (High Sensitivity): 5 ng/L (ref <18)

## 2019-02-16 MED ORDER — FLUOXETINE HCL 10 MG PO CAPS
10.0000 mg | ORAL_CAPSULE | Freq: Once | ORAL | Status: AC
  Administered 2019-02-16: 10 mg via ORAL
  Filled 2019-02-16 (×2): qty 1

## 2019-02-16 MED ORDER — FLUOXETINE HCL 10 MG PO TABS: 10.0000 mg | ORAL_TABLET | Freq: Every day | ORAL | 1 refills | Status: AC

## 2019-02-16 NOTE — ED Provider Notes (Signed)
Emergency Department Provider Note   I have reviewed the triage vital signs and the nursing notes.   HISTORY  Chief Complaint Shortness of Breath   HPI Daniel Valencia is a 53 y.o. male who presents the emergency department today for recurrence of his self-described panic attacks.  Patient states that he has been having an anxiety, sleep issues and panic attacks for last couple months now.  They are always in the same start with shortness of breath and then having palpitations and sweatiness and severe anxiety.  Subsides after period of time.  This happened again tonight.  Exactly the same as the others.  No new symptoms.  No new fever, cough, other infectious symptoms.  No trauma.  No history of cardiac disease.  He went to urgent care for similar symptoms a few days ago and started on Prozac but was only 10 mg weekly he states the day that he took it made him feel better but now I does not seem to be any better.   No other associated or modifying symptoms.    Past Medical History:  Diagnosis Date  . Anxiety 12/30/2011  . Asthma   . DDD (degenerative disc disease)   . High cholesterol   . Hyperlipidemia   . Hypertension   . Unspecified asthma(493.90)     Patient Active Problem List   Diagnosis Date Noted  . Left arm numbness 12/14/2013  . TIA (transient ischemic attack) 11/18/2013  . Ingrown nail 07/07/2013  . Pain, joint, foot, right 07/07/2013  . Bipolar affective disorder ([place]) 02/21/2012  . Allergic rhinitis, cause unspecified 02/21/2012  . Concentration deficit 02/21/2012  . Anxiety disorder 12/30/2011  . Chronic lower back pain 11/12/2011  . Hyperlipidemia   . Routine general medical examination at a health care facility 11/09/2011  . Unspecified asthma(493.90)   . Hypertension     Past Surgical History:  Procedure Laterality Date  . CHOLECYSTECTOMY    . right foot surgury  apirl 2013   [facility]  . right leg surgury     pin in right leg    Current  Outpatient Rx  . Order #: [id number] Class: Normal  . Order #: [id number] Class: Historical Med  . Order #: [id number] Class: Historical Med  . Order #: [id number] Class: Historical Med  . Order #: [id number] Class: Historical Med  . Order #: [id number] Class: Print  . Order #: [id number] Class: Normal    Allergies Peanuts [peanut oil]  Family History  Problem Relation Age of Onset  . Hypertension Father   . Diabetes Sister   . High blood pressure Sister   . High blood pressure Sister   . Stroke Neg Hx   . Heart attack Neg Hx     Social History Social History   Tobacco Use  . Smoking status: Current Every Day Smoker    Packs/day: 1.00    Years: 32.00    Pack years: 32.00    Types: Cigarettes  . Smokeless tobacco: Never Used  Substance Use Topics  . Alcohol use: Yes    Frequency: Never    Comment: occ  . Drug use: No    Review of Systems  All other systems negative except as documented in the HPI. All pertinent positives and negatives as reviewed in the HPI. ____________________________________________   PHYSICAL EXAM:  VITAL SIGNS: ED Triage Vitals  Enc Vitals Group     BP 02/15/19 2138 (!) 161/96     Pulse Rate 02/15/19 2138 90     Resp  02/15/19 2138 16     Temp 02/15/19 2138 98.8 F (37.1 C)     Temp Source 02/15/19 2138 Oral     SpO2 02/15/19 2138 96 %     Weight --      Height --      Head Circumference --      Peak Flow --      Pain Score 02/15/19 2135 0     Pain Loc --      Pain Edu? --      Excl. in GC? --     Constitutional: Alert and oriented. Well appearing and in no acute distress. Eyes: Conjunctivae are normal. PERRL. EOMI. Head: Atraumatic. ose: No congestion/rhinnorhea. Mouth/Throat: Mucous membranes are moist.  Oropharynx non-erythematous. Neck: No stridor.  No meningeal signs.   Cardiovascular: Normal rate, regular rhythm. Good peripheral circulation. Grossly normal heart sounds.   Respiratory: Normal respiratory effort.  No retractions.  Lungs CTAB. Gastrointestinal: Soft and nontender. No distention.  Musculoskeletal: No lower extremity tenderness nor edema. No gross deformities of extremities. Neurologic:  Normal speech and language. No gross focal neurologic deficits are appreciated.  Skin:  Skin is warm, dry and intact. No rash noted.   ____________________________________________   LABS (all labs ordered are listed, but only abnormal results are displayed)  Labs Reviewed  BASIC METABOLIC PANEL - Abnormal; Notable for the following components:      Result Value   Sodium 134 (*)    Glucose, Bld 112 (*)    All other components within normal limits  CBC  TROPONIN I (HIGH SENSITIVITY)  TROPONIN I (HIGH SENSITIVITY)   ____________________________________________  EKG   EKG Interpretation  Date/Time:  Sunday February 15 2019 21:38:08 EDT Ventricular Rate:  98 PR Interval:  176 QRS Duration: 92 QT Interval:  348 QTC Calculation: 444 R Axis:   37 Text Interpretation:  Normal sinus rhythm Normal ECG no significant change from 3 days prior Confirmed by Marily MemosMesner, Chaquetta Schlottman [phone]) on 02/15/2019 11:24:17 PM       ____________________________________________  RADIOLOGY  Dg Chest 2 View  Result Date: 02/15/2019 CLINICAL DATA:  Shortness of breath EXAM: CHEST - 2 VIEW COMPARISON:  02/11/2019 FINDINGS: The heart size and mediastinal contours are within normal limits. Both lungs are clear. The visualized skeletal structures are unremarkable. IMPRESSION: No active cardiopulmonary disease. Electronically Signed   By: Jasmine PangKim  Fujinaga M.D.   On: 02/15/2019 22:10    ____________________________________________   PROCEDURES  Procedure(s) performed:   Procedures   ____________________________________________   INITIAL IMPRESSION / ASSESSMENT AND PLAN / ED COURSE  Likely panic attack. Improved BP's here. ecg ok. Troponin ok. cxr ok. Low suspicion for ACS, doubt PE. Low risk for other emergent causes for symptoms at  this time. Dc to follow up with PCP for recommenmdations for therapy/continuation and recheck of prozac and recheck BP.   Pertinent labs & imaging results that were available during my care of the patient were reviewed by me and considered in my medical decision making (see chart for details).  A medical screening exam was performed and I feel the patient has had an appropriate workup for their chief complaint at this time and likelihood of emergent condition existing is low. They have been counseled on decision, discharge, follow up and which symptoms necessitate immediate return to the emergency department. They or their family verbally stated understanding and agreement with plan and discharged in stable condition.   ____________________________________________  FINAL CLINICAL IMPRESSION(S) / ED DIAGNOSES  Final diagnoses:  SOB (shortness of breath)  Anxiety     MEDICATIONS GIVEN DURING THIS VISIT:  Medications  FLUoxetine (PROZAC) capsule 10 mg (has no administration in time range)     NEW OUTPATIENT MEDICATIONS STARTED DURING THIS VISIT:  Current Discharge Medication List      Note:  This note was prepared with assistance of Dragon voice recognition software. Occasional wrong-word or sound-a-like substitutions may have occurred due to the inherent limitations of voice recognition software.   Shakhia Gramajo, Barbara Cower, MD 02/16/19 0040

## 2019-02-16 NOTE — ED Triage Notes (Signed)
Pt c/o SOB, HA and "numbness to my chest" x 20-30 min-pt seen [facility] yesterday for SOB and seen by PCP for f/u for dx panic attacks -NAD-steady gait

## 2019-02-23 ENCOUNTER — Other Ambulatory Visit: Payer: Self-pay

## 2019-02-23 ENCOUNTER — Ambulatory Visit (HOSPITAL_COMMUNITY)
Admission: EM | Admit: 2019-02-23 | Discharge: 2019-02-23 | Disposition: A | Discharge status: Home / Self Care | Payer: PRIVATE HEALTH INSURANCE | Attending: Family Medicine | Admitting: Family Medicine

## 2019-02-23 ENCOUNTER — Encounter (HOSPITAL_COMMUNITY): Payer: Self-pay | Admitting: Emergency Medicine

## 2019-02-23 DIAGNOSIS — R1010 Upper abdominal pain, unspecified: Secondary | ICD-10-CM

## 2019-02-23 NOTE — ED Provider Notes (Signed)
[place]    CSN: [id number] Arrival date & time: 02/23/19  1703      History   Chief Complaint Chief Complaint  Patient presents with  . Appointment    510  . Abdominal Pain    HPI Daniel Valencia is a 53 y.o. male.   He is presenting with recurrent abdominal pain.  He has been seen in the emergency department for the similar pain.  He is tried several medications with limited improvement.  He reports his pain mainly upper quadrant.  Has a history of a cholecystectomy.  Pain seems to be worse if he lies down at night.  Denies any diaphoresis or radiation to the left shoulder.  He has a history of some dark stools.  Denies any NSAID use.  Denies any travel.  Denies any recent illness.  HPI  Past Medical History:  Diagnosis Date  . Anxiety 12/30/2011  . Asthma   . DDD (degenerative disc disease)   . High cholesterol   . Hyperlipidemia   . Hypertension   . Unspecified asthma(493.90)     Patient Active Problem List   Diagnosis Date Noted  . Left arm numbness 12/14/2013  . TIA (transient ischemic attack) 11/18/2013  . Ingrown nail 07/07/2013  . Pain, joint, foot, right 07/07/2013  . Bipolar affective disorder ([place]) 02/21/2012  . Allergic rhinitis, cause unspecified 02/21/2012  . Concentration deficit 02/21/2012  . Anxiety disorder 12/30/2011  . Chronic lower back pain 11/12/2011  . Hyperlipidemia   . Routine general medical examination at a health care facility 11/09/2011  . Unspecified asthma(493.90)   . Hypertension     Past Surgical History:  Procedure Laterality Date  . CHOLECYSTECTOMY    . right foot surgury  apirl 2013   [facility]  . right leg surgury     pin in right leg       Home Medications    Prior to Admission medications   Medication Sig Start Date End Date Taking? Authorizing Provider  albuterol (PROVENTIL HFA;VENTOLIN HFA) 108 (90 Base) MCG/ACT inhaler Inhale 2 puffs into the lungs every 4 (four) hours as needed for wheezing or  shortness of breath. 12/06/17   Tasia Catchings, Amy V, PA-C  amLODipine (NORVASC) 10 MG tablet Take 10 mg by mouth daily.     [provider]  FLUoxetine (PROZAC) 10 MG tablet Take 1 tablet (10 mg total) by mouth daily. 02/16/19   Mesner, Corene Cornea, MD  losartan (COZAAR) 50 MG tablet Take 50 mg by mouth daily.    [provider]  multivitamin (ONE-A-DAY MEN'S) TABS Take 1 tablet by mouth daily.    [provider]  simvastatin (ZOCOR) 20 MG tablet Take 20 mg by mouth daily. 11/03/18   [provider]  sucralfate (CARAFATE) 1 g tablet Take 1 tablet (1 g total) by mouth 4 (four) times daily as needed. Patient taking differently: Take 1 g by mouth 4 (four) times daily as needed.  02/11/19   Maudie Flakes, MD  fluticasone (FLONASE) 50 MCG/ACT nasal spray Place 2 sprays into both nostrils daily. Patient not taking: Reported on 02/11/2019 07/08/17 02/14/19  Ok Edwards, PA-C  metoprolol tartrate (LOPRESSOR) 25 MG tablet Take 0.5 tablets (12.5 mg total) by mouth daily as needed. Patient not taking: Reported on 02/11/2019 07/29/17 02/14/19  Drenda Freeze, MD  omeprazole (PRILOSEC) 20 MG capsule Take 1 capsule (20 mg total) by mouth daily. 02/11/19 02/14/19  Maudie Flakes, MD  Family History Family History  Problem Relation Age of Onset  . Hypertension Father   . Diabetes Sister   . High blood pressure Sister   . High blood pressure Sister   . Stroke Neg Hx   . Heart attack Neg Hx     Social History Social History   Tobacco Use  . Smoking status: Current Every Day Smoker    Packs/day: 1.00    Years: 32.00    Pack years: 32.00    Types: Cigarettes  . Smokeless tobacco: Never Used  Substance Use Topics  . Alcohol use: Yes    Frequency: Never    Comment: occ  . Drug use: No     Allergies   Peanuts [peanut oil]   Review of Systems Review of Systems  Constitutional: Negative for fever.  HENT: Negative for congestion.   Respiratory: Negative for cough.   Cardiovascular:  Negative for chest pain.  Gastrointestinal: Positive for abdominal pain and diarrhea.  Musculoskeletal: Negative for back pain.  Skin: Negative for color change.  Neurological: Negative for weakness.  Hematological: Negative for adenopathy.     Physical Exam Triage Vital Signs ED Triage Vitals  Enc Vitals Group     BP 02/23/19 1721 (!) 134/92     Pulse Rate 02/23/19 1721 91     Resp 02/23/19 1721 16     Temp 02/23/19 1721 98.2 F (36.8 C)     Temp Source 02/23/19 1721 Temporal     SpO2 02/23/19 1721 98 %     Weight --      Height --      Head Circumference --      Peak Flow --      Pain Score 02/23/19 1753 3     Pain Loc --      Pain Edu? --      Excl. in GC? --    No data found.  Updated Vital Signs BP (!) 134/92 (BP Location: Right Arm)   Pulse 91   Temp 98.2 F (36.8 C) (Temporal)   Resp 16   SpO2 98%   Visual Acuity Right Eye Distance:   Left Eye Distance:   Bilateral Distance:    Right Eye Near:   Left Eye Near:    Bilateral Near:     Physical Exam Gen: NAD, alert, cooperative with exam, well-appearing ENT: normal lips, normal nasal mucosa,  Eye: normal EOM, normal conjunctiva and lids CV:  no edema, +2 pedal pulses   Resp: no accessory muscle use, non-labored,  GI: no masses, mild tenderness in the upper quadrant, soft, nontender nondistended, positive bowel sounds, no hernia  Skin: no rashes, no areas of induration  Neuro: normal tone, normal sensation to touch Psych:  normal insight, alert and oriented MSK: Normal gait, normal strength  UC Treatments / Results  Labs (all labs ordered are listed, but only abnormal results are displayed) Labs Reviewed - No data to display  EKG   Radiology No results found.  Procedures Procedures (including critical care time)  Medications Ordered in UC Medications - No data to display  Initial Impression / Assessment and Plan / UC Course  I have reviewed the triage vital signs and the nursing notes.   Pertinent labs & imaging results that were available during my care of the patient were reviewed by me and considered in my medical decision making (see chart for details).     [name] is a 53 year old male that is presenting with upper quadrant abdominal  pain.  He is tried several conservative measures with limited improvement.  Advised that he be seen by gastroenterology at some point in order to have a scope performed.  Counseled on supportive care.  Given indications to seek immediate care and follow-up.  Final Clinical Impressions(s) / UC Diagnoses   Final diagnoses:  Pain of upper abdomen     Discharge Instructions     Please try getting an appointment with the [facility] Please be seen in the emergency dept. If you pass out or your symptoms worsen.     ED Prescriptions    None     PDMP not reviewed this encounter.   Myra Rude, MD 02/23/19 [phone]

## 2019-02-23 NOTE — ED Triage Notes (Signed)
Pt here for generalized abd pain; pt seen here recently for same and now returning

## 2019-02-23 NOTE — Discharge Instructions (Signed)
Please try getting an appointment with the [facility] Please be seen in the emergency dept. If you pass out or your symptoms worsen.

## 2019-03-16 ENCOUNTER — Encounter (HOSPITAL_COMMUNITY): Payer: Self-pay | Admitting: Emergency Medicine

## 2019-03-16 ENCOUNTER — Emergency Department (HOSPITAL_COMMUNITY): Payer: PRIVATE HEALTH INSURANCE

## 2019-03-16 ENCOUNTER — Other Ambulatory Visit: Payer: Self-pay

## 2019-03-16 ENCOUNTER — Emergency Department (HOSPITAL_COMMUNITY)
Admission: EM | Admit: 2019-03-16 | Discharge: 2019-03-17 | Disposition: A | Discharge status: Home / Self Care | Payer: PRIVATE HEALTH INSURANCE | Attending: Emergency Medicine | Admitting: Emergency Medicine

## 2019-03-16 DIAGNOSIS — Z5321 Procedure and treatment not carried out due to patient leaving prior to being seen by health care provider: Secondary | ICD-10-CM | POA: Diagnosis not present

## 2019-03-16 DIAGNOSIS — R0602 Shortness of breath: Secondary | ICD-10-CM | POA: Insufficient documentation

## 2019-03-16 DIAGNOSIS — R0789 Other chest pain: Secondary | ICD-10-CM | POA: Diagnosis present

## 2019-03-16 LAB — CBC
HCT: 43.4 % (ref 39.0–52.0)
Hemoglobin: 14.9 g/dL (ref 13.0–17.0)
MCH: 31.8 pg (ref 26.0–34.0)
MCHC: 34.3 g/dL (ref 30.0–36.0)
MCV: 92.5 fL (ref 80.0–100.0)
Platelets: 310 10*3/uL (ref 150–400)
RBC: 4.69 MIL/uL (ref 4.22–5.81)
RDW: 14 % (ref 11.5–15.5)
WBC: 6.8 10*3/uL (ref 4.0–10.5)
nRBC: 0 % (ref 0.0–0.2)

## 2019-03-16 LAB — BASIC METABOLIC PANEL
Anion gap: 10 (ref 5–15)
BUN: 17 mg/dL (ref 6–20)
CO2: 22 mmol/L (ref 22–32)
Calcium: 9.2 mg/dL (ref 8.9–10.3)
Chloride: 104 mmol/L (ref 98–111)
Creatinine, Ser: 0.87 mg/dL (ref 0.61–1.24)
GFR calc Af Amer: >60 mL/min (ref >60)
GFR calc non Af Amer: >60 mL/min (ref >60)
Glucose, Bld: 106 mg/dL — ABNORMAL HIGH (ref 70–99)
Potassium: 3.7 mmol/L (ref 3.5–5.1)
Sodium: 136 mmol/L (ref 135–145)

## 2019-03-16 LAB — TROPONIN I (HIGH SENSITIVITY): Troponin I (High Sensitivity): 4 ng/L (ref <18)

## 2019-03-16 MED ORDER — SODIUM CHLORIDE 0.9% FLUSH: 3.0000 mL | Freq: Once | INTRAVENOUS | Status: DC

## 2019-03-16 NOTE — ED Triage Notes (Signed)
Patient here from home with complaints of chest pain and SOB that started today. Reports taking aspirin right before coming in. Describes pain as "weird flutter". No pain currently.

## 2019-11-10 IMAGING — CR DG CHEST 2V
2 series · 2 of 2 positions shown · non-contrast
Comparison: February 15, 2019

CLINICAL DATA: Chest pain.

EXAM:
CHEST - 2 VIEW

[w chest pa]
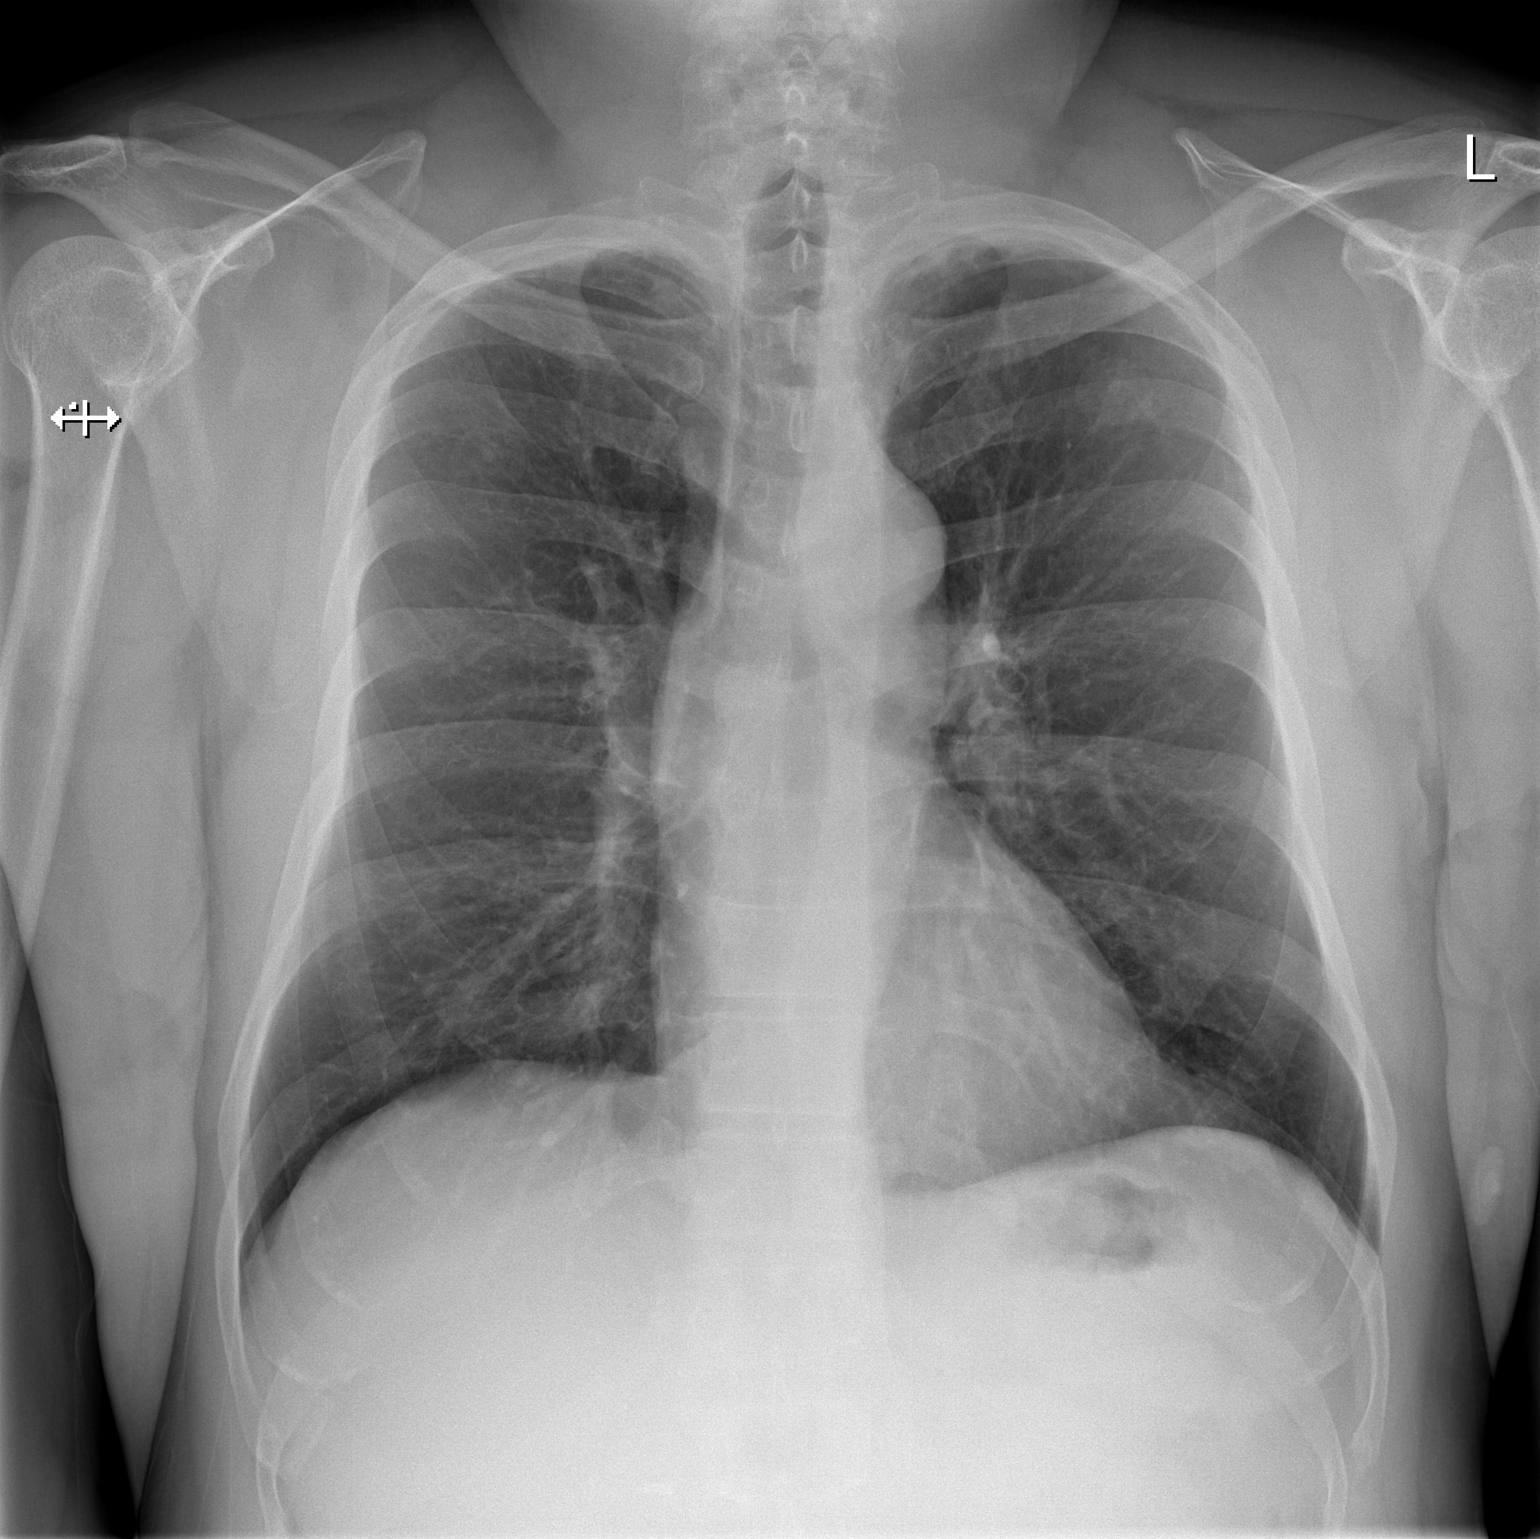

[w chest lat]
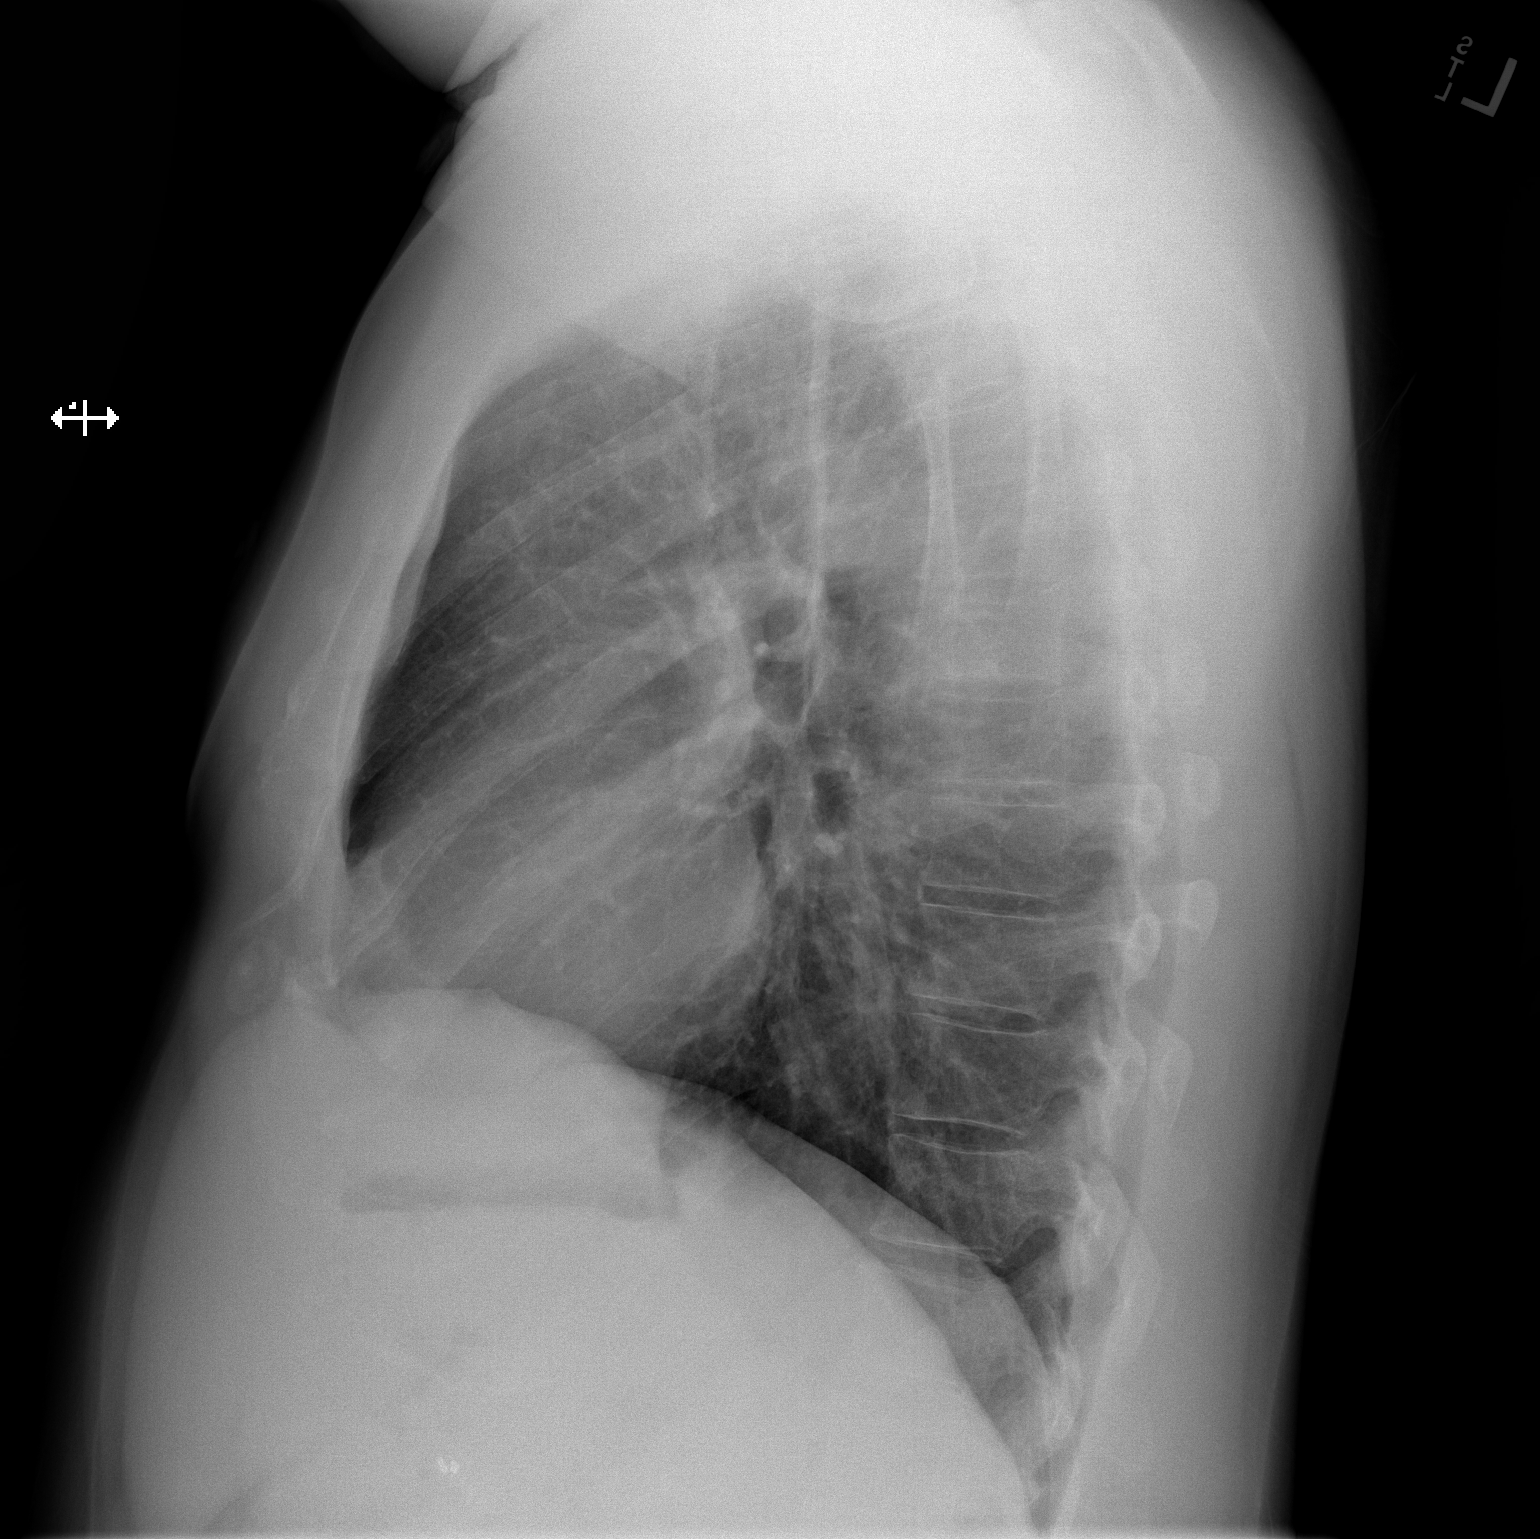

[2 of 2 positions shown; findings below may reference images not displayed]

FINDINGS: Cardiomediastinal silhouette is normal. Mediastinal contours appear
intact.

There is no evidence of focal airspace consolidation, pleural
effusion or pneumothorax.

Osseous structures are without acute abnormality. Soft tissues are
grossly normal.
IMPRESSION: No active cardiopulmonary disease.
